# Patient Record
Sex: Female | Born: 1960 | Race: Black or African American | Hispanic: No | Marital: Single | Smoking: Never smoker
Health system: Southern US, Community
[De-identification: ages and names within clinical notes are randomized; demographics above are authoritative.]

## PROBLEM LIST (undated history)

## (undated) DIAGNOSIS — R569 Unspecified convulsions: Secondary | ICD-10-CM

## (undated) DIAGNOSIS — R011 Cardiac murmur, unspecified: Secondary | ICD-10-CM

## (undated) HISTORY — DX: Cardiac murmur, unspecified: R01.1

---

## 2002-03-11 ENCOUNTER — Emergency Department (HOSPITAL_COMMUNITY): Admission: EM | Admit: 2002-03-11 | Discharge: 2002-03-11 | Payer: Self-pay | Admitting: Emergency Medicine

## 2002-03-11 ENCOUNTER — Encounter: Payer: Self-pay | Admitting: Emergency Medicine

## 2002-03-11 ENCOUNTER — Encounter: Payer: Self-pay | Admitting: Neurology

## 2002-07-22 ENCOUNTER — Encounter (INDEPENDENT_AMBULATORY_CARE_PROVIDER_SITE_OTHER): Payer: Self-pay | Admitting: *Deleted

## 2002-07-22 ENCOUNTER — Ambulatory Visit (HOSPITAL_BASED_OUTPATIENT_CLINIC_OR_DEPARTMENT_OTHER): Admission: RE | Admit: 2002-07-22 | Discharge: 2002-07-22 | Payer: Self-pay | Admitting: Obstetrics and Gynecology

## 2008-03-07 ENCOUNTER — Emergency Department (HOSPITAL_COMMUNITY): Admission: EM | Admit: 2008-03-07 | Discharge: 2008-03-07 | Payer: Self-pay | Admitting: Emergency Medicine

## 2011-03-07 NOTE — Consult Note (Signed)
[place]. [facility]  Patient:    Jasmine Lester, Jasmine Lester Visit Number: [id number] MRN: [id number]          Service Type: EMS Location: [facility] [place] Attending Physician:  Devoria Albe Dictated by:   Genene Churn. Love, M.D. Proc. Date: 03/11/02 Admit Date:  03/11/2002 Discharge Date: 03/11/2002   CC:         Jasmine Lester, M.D. [facility]   Consultation Report  PATIENT ADDRESS:  [address], [place], [place] [address]  DATE OF BIRTH:  [birth year]  REASON FOR CONSULTATION:  This 50 year old, right-handed, black, single female who lives with her mother is seen in the emergency room at the request of the emergency room physician, Dr. Rae Lester, for evaluation of seizure.  HISTORY OF PRESENT ILLNESS:  Jasmine Lester is a 50 year old, right-handed, black, single female who lives with her mother.  She has no known prior history of seizures.  This morning at about 4 a.m., her mother heard a thump and found the patient having a generalized seizure.  The patient had no history of previous seizures and denies any known warning of seizures such as macropsia, micropsia, deja vu, strange odors or taste.  There was no urinary or bowel incontinence.  There was no tongue biting, and she was brought to the [facility] Emergency Room where initial CT scan showed evidence of calcium versus hemorrhage in the right parietal region and then also an area in the left temporal region.  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  The possibility of metastatic melanoma could not 100% be ruled out.  The patient has lost some weight recently, and she has a known long history of anemia for which she has been on iron therapy and Centrum Silver once per day, but she is on no other medications.  She denies any history of drug or alcohol abuse.  There has been no recent history of headaches, single eye  vision loss, double vision, swallowing problems, slurred speech or seizures.  She denies any known history of head trauma.  PAST MEDICAL HISTORY:  Significant for anemia.  There has been no hypertension, diabetes, heart disease, stroke, cancer, convulsions, unconsciousness, or veneral disease.  FAMILY HISTORY:  Her mother is age 32 living and well.  Her father died at 63 in a motor vehicle accident.  She has one sister, 3, living and well.  She has one son, 53, living and well.  There is no family history of seizures. There is no family history of stroke, cancer, or convulsions.  Her neonatal history reveals that she was 6-pound, full-term pregnancy.  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  She has stat breathing and crying time and normal developmental milestones.  She finished high school and is currently working at a call center.  MEDICATIONS:  Ferrous sulfate and Centrum Silver.  HABITS:  She does not drink alcohol.  She does not smoke cigarettes.  ALLERGIES:  No known allergies.  PHYSICAL EXAMINATION:  GENERAL:  Well-developed, pleasant black female in no acute distress.  VITAL SIGNS:  Blood pressure right and left arm was 120/80, heart rate 64 and regular.  HEAD AND NECK:  There were no cranial, carotid, or supraclavicular bruits heard.  Neck flexion and extension maneuvers were unremarkable.  She had some loss of hair in the right parietal region.  NEUROLOGIC:  Mental Status:  She was alert and oriented x 3.  She followed one, two, and three-step commands.  There was no denial of illness over the left side of her body.  Cranial nerve examination revealed visual fields to be full, disks flat. Spontaneous venous pulsation seen.  Extraocular movements full.  Corneals present.  Facial sensation equal.  No facial sensory asymmetry, but she had definite decrease in left nasolabial fold versus the right.  Tongue  was midline.  Uvula was midline.  Gags were present.  Sternocleidomastoid and trapezius testing were normal.  Motor examination revealed 5/5 strength proximally and distally in the upper and lower extremities without any evidence of proximal, pronator, or distal drift.  Coordination testing revealed finger-to-nose, heel-to-shin, and rapid alternating movements to be normal.  Sensory examination was intact to pinprick, touch, change of position, and vibration testing.  Deep tendon reflexes were 1 to 2+, and plantar responses were downgoing.  Gait examination was not performed.  DIAGNOSTIC DATA:  Her white blood cell count was 6500, hemoglobin 11.5, hematocrit 35.4, platelet count 268,000.  Sodium 141, potassium 3.6, chloride 106, CO2 content 29, BUN 7, creatinine 0.7, glucose 110, calcium 9.3.  Liver function tests normal.  Chest x-ray pending.  CT scan of the brain and MRI of the brain as above.  IMPRESSION: 1. Seizure (Code 345.1). 2. Suspect right parietal and left temporal cavernous arteriovenous    malformation. 3. Anemia.  PLAN:  Place patient on seizure precautions and have her not drive a car. Will place her on Tegretol XR 200 mg b.i.d. and warn her about side effects. She will have a CBC, comprehensive metabolic panel, and carbamazepine level in three weeks.  She will return to see me or Jasmine Lester in four weeks. Dictated by:   Genene Churn. Love, M.D. Attending Physician:  Devoria Albe DD:  03/11/02 TD:  03/14/02 Job: [id number]

## 2011-03-07 NOTE — Op Note (Signed)
   NAMEZITLALI, PRIMM                       ACCOUNT NO.:  [id number]   MEDICAL RECORD NO.:  [id number]                   PATIENT TYPE:  AMB   LOCATION:  [facility]                                 FACILITY:  [facility]   PHYSICIAN:  Katherine Roan, M.D.               DATE OF BIRTH:  20-Apr-1961   DATE OF PROCEDURE:  07/22/2002  DATE OF DISCHARGE:                                 OPERATIVE REPORT   PREOPERATIVE DIAGNOSIS:  Large uterine fibroid with menorrhagia.   POSTOPERATIVE DIAGNOSIS:  Large uterine fibroid with menorrhagia.   OPERATION:  Hysteroscopy with resection of large uterine fibroid.   DESCRIPTION OF PROCEDURE:  The patient was placed in lithotomy position and  examined under anesthesia.  The uterus was about 10-12 weeks in size,  mobile.  No other adnexal masses were noted.  The cervix was then carefully  dilated, and the hysteroscope was inserted.  There were two large uterine  fibroids that were submucosal in nature.  These were resected, and all of  the resected material was sent to the lab for study.  About halfway through  the procedure, I injected the uterus with a Pitressin solution and then  completed the resection.  All of the resected material was sent to the lab  for study.  No unusual blood loss occurred.  There was no fluid deficit.  Ms. Dorwart tolerated the procedure well and was sent to the recovery room  in good condition.                                               Katherine Roan, M.D.    SDM/MEDQ  D:  07/22/2002  T:  07/22/2002  Job:  [id number]   cc:   Genene Churn. Love, MD  [address]  [place]  [place] [address]  Fax: [phone]

## 2014-09-15 ENCOUNTER — Emergency Department (HOSPITAL_COMMUNITY): Payer: Self-pay

## 2014-09-15 ENCOUNTER — Observation Stay (HOSPITAL_COMMUNITY)
Admission: EM | Admit: 2014-09-15 | Discharge: 2014-09-17 | Disposition: A | Discharge status: Home / Self Care | Payer: Self-pay | Attending: General Surgery | Admitting: General Surgery

## 2014-09-15 ENCOUNTER — Encounter (HOSPITAL_COMMUNITY): Payer: Self-pay | Admitting: *Deleted

## 2014-09-15 DIAGNOSIS — R100 Acute abdomen: Secondary | ICD-10-CM | POA: Insufficient documentation

## 2014-09-15 DIAGNOSIS — Z9049 Acquired absence of other specified parts of digestive tract: Secondary | ICD-10-CM

## 2014-09-15 DIAGNOSIS — K8000 Calculus of gallbladder with acute cholecystitis without obstruction: Principal | ICD-10-CM | POA: Insufficient documentation

## 2014-09-15 DIAGNOSIS — R109 Unspecified abdominal pain: Secondary | ICD-10-CM

## 2014-09-15 DIAGNOSIS — K810 Acute cholecystitis: Secondary | ICD-10-CM

## 2014-09-15 DIAGNOSIS — R1011 Right upper quadrant pain: Secondary | ICD-10-CM

## 2014-09-15 HISTORY — DX: Unspecified convulsions: R56.9

## 2014-09-15 LAB — COMPREHENSIVE METABOLIC PANEL
ALBUMIN: 3.9 g/dL (ref 3.5–5.2)
ALT: 8 U/L (ref 0–35)
ANION GAP: 16 — AB (ref 5–15)
AST: 17 U/L (ref 0–37)
Alkaline Phosphatase: 101 U/L (ref 39–117)
BILIRUBIN TOTAL: 0.6 mg/dL (ref 0.3–1.2)
BUN: 5 mg/dL — AB (ref 6–23)
CHLORIDE: 100 meq/L (ref 96–112)
CO2: 22 mEq/L (ref 19–32)
CREATININE: 0.53 mg/dL (ref 0.50–1.10)
Calcium: 9.6 mg/dL (ref 8.4–10.5)
GLUCOSE: 111 mg/dL — AB (ref 70–99)
Potassium: 3.7 mEq/L (ref 3.7–5.3)
Sodium: 138 mEq/L (ref 137–147)
Total Protein: 8.6 g/dL — ABNORMAL HIGH (ref 6.0–8.3)

## 2014-09-15 LAB — URINALYSIS, ROUTINE W REFLEX MICROSCOPIC
BILIRUBIN URINE: NEGATIVE
GLUCOSE, UA: NEGATIVE mg/dL
Hgb urine dipstick: NEGATIVE
KETONES UR: 15 mg/dL — AB
Leukocytes, UA: NEGATIVE
Nitrite: NEGATIVE
PH: 7.5 (ref 5.0–8.0)
Protein, ur: NEGATIVE mg/dL
SPECIFIC GRAVITY, URINE: 1.006 (ref 1.005–1.030)
Urobilinogen, UA: 0.2 mg/dL (ref 0.0–1.0)

## 2014-09-15 LAB — CBC WITH DIFFERENTIAL/PLATELET
Basophils Absolute: 0 10*3/uL (ref 0.0–0.1)
Basophils Relative: 0 % (ref 0–1)
EOS PCT: 0 % (ref 0–5)
Eosinophils Absolute: 0 10*3/uL (ref 0.0–0.7)
HEMATOCRIT: 41.8 % (ref 36.0–46.0)
HEMOGLOBIN: 13.9 g/dL (ref 12.0–15.0)
LYMPHS PCT: 11 % — AB (ref 12–46)
Lymphs Abs: 1.1 10*3/uL (ref 0.7–4.0)
MCH: 26.5 pg (ref 26.0–34.0)
MCHC: 33.3 g/dL (ref 30.0–36.0)
MCV: 79.8 fL (ref 78.0–100.0)
MONO ABS: 0.7 10*3/uL (ref 0.1–1.0)
Monocytes Relative: 7 % (ref 3–12)
Neutro Abs: 8.9 10*3/uL — ABNORMAL HIGH (ref 1.7–7.7)
Neutrophils Relative %: 82 % — ABNORMAL HIGH (ref 43–77)
Platelets: 262 10*3/uL (ref 150–400)
RBC: 5.24 MIL/uL — ABNORMAL HIGH (ref 3.87–5.11)
RDW: 15.6 % — ABNORMAL HIGH (ref 11.5–15.5)
WBC: 10.8 10*3/uL — AB (ref 4.0–10.5)

## 2014-09-15 LAB — TROPONIN I: Troponin I: <0.3 ng/mL (ref <0.30)

## 2014-09-15 LAB — LIPASE, BLOOD: Lipase: 11 U/L (ref 11–59)

## 2014-09-15 MED ORDER — IOHEXOL 300 MG/ML  SOLN
100.0000 mL | Freq: Once | INTRAMUSCULAR | Status: AC | PRN
  Administered 2014-09-15: 100 mL via INTRAVENOUS

## 2014-09-15 MED ORDER — FENTANYL CITRATE 0.05 MG/ML IJ SOLN
50.0000 ug | Freq: Once | INTRAMUSCULAR | Status: AC
  Administered 2014-09-15: 50 ug via INTRAVENOUS

## 2014-09-15 MED ORDER — SODIUM CHLORIDE 0.9 % IV SOLN
1000.0000 mL | Freq: Once | INTRAVENOUS | Status: AC
  Administered 2014-09-15: 1000 mL via INTRAVENOUS

## 2014-09-15 MED ORDER — SODIUM CHLORIDE 0.9 % IV BOLUS (SEPSIS): 1000.0000 mL | Freq: Once | INTRAVENOUS | Status: AC

## 2014-09-15 MED ORDER — ONDANSETRON HCL 4 MG/2ML IJ SOLN
4.0000 mg | Freq: Once | INTRAMUSCULAR | Status: AC
  Administered 2014-09-15: 4 mg via INTRAVENOUS
  Filled 2014-09-15: qty 2

## 2014-09-15 MED ORDER — FENTANYL CITRATE 0.05 MG/ML IJ SOLN
50.0000 ug | Freq: Once | INTRAMUSCULAR | Status: AC
  Administered 2014-09-15: 50 ug via INTRAVENOUS
  Filled 2014-09-15: qty 2

## 2014-09-15 MED ORDER — FENTANYL CITRATE 0.05 MG/ML IJ SOLN
100.0000 ug | Freq: Once | INTRAMUSCULAR | Status: DC
  Filled 2014-09-15: qty 2

## 2014-09-15 MED ORDER — IOHEXOL 300 MG/ML  SOLN
25.0000 mL | Freq: Once | INTRAMUSCULAR | Status: AC | PRN
  Administered 2014-09-15: 25 mL via ORAL

## 2014-09-15 NOTE — ED Notes (Signed)
Pt in c/o abd pain and n/v/d that started last night, worse today

## 2014-09-15 NOTE — ED Provider Notes (Signed)
CSN: [id number]     Arrival date & time 09/15/14  1909 History   First MD Initiated Contact with Patient 09/15/14 1930     Chief Complaint  Patient presents with  . Abdominal Pain     (Consider location/radiation/quality/duration/timing/severity/associated sxs/prior Treatment) Patient is a 53 y.o. female presenting with abdominal pain.  Abdominal Pain Pain location:  Generalized Pain quality: sharp   Pain radiates to:  Does not radiate Pain severity:  Severe Onset quality:  Gradual Duration:  17 hours Timing:  Constant Progression:  Worsening Chronicity:  New Context comment:  Spontaneous Relieved by:  Nothing Worsened by:  Palpation and movement Associated symptoms: anorexia, nausea and vomiting   Associated symptoms: no constipation, no diarrhea and no fever     Past Medical History  Diagnosis Date  . Seizures    History reviewed. No pertinent past surgical history. History reviewed. No pertinent family history. History  Substance Use Topics  . Smoking status: Never Smoker   . Smokeless tobacco: Not on file  . Alcohol Use: Not on file   OB History    No data available     Review of Systems  Constitutional: Negative for fever.  Gastrointestinal: Positive for nausea, vomiting, abdominal pain and anorexia. Negative for diarrhea and constipation.  All other systems reviewed and are negative.     Allergies  Review of patient's allergies indicates no known allergies.  Home Medications   Prior to Admission medications   Medication Sig Start Date End Date Taking? Authorizing Provider  oxyCODONE-acetaminophen (PERCOCET/ROXICET) 5-325 MG per tablet Take 1-2 tablets by mouth every 4 (four) hours as needed for moderate pain. 09/17/14   Pedro Earls, MD   BP 139/82 mmHg  Pulse 91  Temp(Src) 98.1 F (36.7 C) (Oral)  Resp 18  SpO2 97%  LMP  Physical Exam  Constitutional: She is oriented to person, place, and time. She appears well-developed and  well-nourished.  HENT:  Head: Normocephalic and atraumatic.  Right Ear: External ear normal.  Left Ear: External ear normal.  Eyes: Conjunctivae and EOM are normal. Pupils are equal, round, and reactive to light.  Neck: Normal range of motion. Neck supple.  Cardiovascular: Normal rate, regular rhythm, normal heart sounds and intact distal pulses.   Pulmonary/Chest: Effort normal and breath sounds normal.  Abdominal: Soft. Bowel sounds are normal. There is generalized tenderness.  Musculoskeletal: Normal range of motion.  Neurological: She is alert and oriented to person, place, and time.  Skin: Skin is warm and dry.  Vitals reviewed.   ED Course  Procedures (including critical care time) Labs Review Labs Reviewed  CBC WITH DIFFERENTIAL - Abnormal; Notable for the following:    WBC 10.8 (*)    RBC 5.24 (*)    RDW 15.6 (*)    Neutrophils Relative % 82 (*)    Neutro Abs 8.9 (*)    Lymphocytes Relative 11 (*)    All other components within normal limits  URINALYSIS, ROUTINE W REFLEX MICROSCOPIC - Abnormal; Notable for the following:    Ketones, ur 15 (*)    All other components within normal limits  COMPREHENSIVE METABOLIC PANEL - Abnormal; Notable for the following:    Glucose, Bld 111 (*)    BUN 5 (*)    Total Protein 8.6 (*)    Anion gap 16 (*)    All other components within normal limits  SURGICAL PCR SCREEN  LIPASE, BLOOD  TROPONIN I  SURGICAL PATHOLOGY    Imaging Review Ct  Abdomen Pelvis W Contrast  09/15/2014   CLINICAL DATA:  Acute generalized abdominal pain.  EXAM: CT ABDOMEN AND PELVIS WITH CONTRAST  TECHNIQUE: Multidetector CT imaging of the abdomen and pelvis was performed using the standard protocol following bolus administration of intravenous contrast.  CONTRAST:  130mL OMNIPAQUE IOHEXOL 300 MG/ML  SOLN  COMPARISON:  None.  FINDINGS: Visualized lung bases appear normal. No significant osseous abnormality is noted.  Cholelithiasis is noted with minimal  pericholecystic fluid seen. The liver, spleen and pancreas appear normal. Adrenal glands appear normal. Cortical scarring of right kidney is noted. Simple cyst is seen in lower pole. No hydronephrosis or renal obstruction is noted. There is no evidence of bowel obstruction.  Enlarged uterus is noted with multiple fibroids. The largest measures 9.6 x 8.3 cm. 3.1 cm exophytic fibroid is node arising from the left of the uterine fundus. Ovaries appear normal. No significant adenopathy is noted. Urinary bladder appears normal.  IMPRESSION: Cholelithiasis is noted with minimal pericholecystic fluid. Further evaluation with HIDA scan is recommended to evaluate for possible cholecystitis.  Cortical scarring of right kidney is noted. No hydronephrosis or renal obstruction is noted.  Uterus is significantly enlarged consistent with fibroid uterus. The largest individual fibroid measures 9.6 cm in diameter.   Electronically Signed   By: Sabino Dick M.D.   On: 09/15/2014 21:15   US Abdomen Limited  09/15/2014   CLINICAL DATA:  Acute onset of right upper quadrant abdominal pain. Initial encounter.  EXAM: US ABDOMEN LIMITED - RIGHT UPPER QUADRANT  COMPARISON:  CT of the abdomen and pelvis performed earlier today at 8:47 p.m.  FINDINGS: Gallbladder:  There is diffuse gallbladder wall thickening, measuring 0.4 cm, with numerous stones dependently in the gallbladder. The gallbladder is mildly distended. A positive ultrasonographic Murphy's sign is elicited, raising concern for acute cholecystitis. No definite pericholecystic fluid is seen.  Common bile duct:  Diameter: 0.5 cm, within normal limits in caliber.  Liver:  A 1.0 cm echogenic focus at the medial right hepatic lobe is thought reflects a hemangioma, given its imaging characteristics. No additional focal lesions are seen. The liver is otherwise unremarkable in appearance.  IMPRESSION: 1. Suspect acute cholecystitis, with diffuse gallbladder wall thickening, mild  gallbladder distention and underlying cholelithiasis. Positive ultrasonographic Murphy's sign elicited. 2. Likely 1.0 cm hemangioma incidentally noted within the medial right hepatic lobe.   Electronically Signed   By: Garald Balding M.D.   On: 09/15/2014 23:53     EKG Interpretation   Date/Time:  Friday September 15 2014 19:49:43 EST Ventricular Rate:  81 PR Interval:  148 QRS Duration: 84 QT Interval:  368 QTC Calculation: 427 R Axis:   68 Text Interpretation:  Sinus rhythm No old tracing to compare Confirmed by  Debby Freiberg [phone]) on 09/15/2014 10:15:56 PM      MDM   Final diagnoses:  Abdominal pain, acute  RUQ abdominal pain  Acute cholecystitis    53 y.o. female with pertinent PMH of seizures presents with abdominal pain as described above. No infectious symptoms. She has had nausea and vomiting with abdominal pain. On arrival vital signs and physical exam as above. Patient has generalized abdominal tenderness however this was worse in the epigastrium. Labs obtained and as above. CT scan of the abdomen obtained.   This demonstrated pericholecystic fluid.  [place] ordered.  This demonstrated cholecystitis.  Admitted to surgery  1. Acute cholecystitis   2. Abdominal pain, acute   3. RUQ abdominal pain  Debby Freiberg, MD 09/17/14 [phone]

## 2014-09-16 ENCOUNTER — Observation Stay (HOSPITAL_COMMUNITY): Payer: Self-pay | Admitting: Certified Registered"

## 2014-09-16 ENCOUNTER — Encounter (HOSPITAL_COMMUNITY)
Admission: EM | Disposition: A | Discharge status: Home / Self Care | Payer: Self-pay | Source: Home / Self Care | Attending: Emergency Medicine

## 2014-09-16 DIAGNOSIS — K810 Acute cholecystitis: Secondary | ICD-10-CM | POA: Diagnosis present

## 2014-09-16 HISTORY — PX: CHOLECYSTECTOMY: SHX55

## 2014-09-16 LAB — SURGICAL PCR SCREEN
MRSA, PCR: NEGATIVE
STAPHYLOCOCCUS AUREUS: NEGATIVE

## 2014-09-16 SURGERY — LAPAROSCOPIC CHOLECYSTECTOMY WITH INTRAOPERATIVE CHOLANGIOGRAM
Anesthesia: General | Site: Abdomen

## 2014-09-16 MED ORDER — DEXAMETHASONE SODIUM PHOSPHATE 10 MG/ML IJ SOLN
INTRAMUSCULAR | Status: DC | PRN
  Administered 2014-09-16: 4 mg via INTRAVENOUS

## 2014-09-16 MED ORDER — LACTATED RINGERS IV SOLN
INTRAVENOUS | Status: DC | PRN
  Administered 2014-09-16: 09:00:00 via INTRAVENOUS

## 2014-09-16 MED ORDER — PROPOFOL 10 MG/ML IV BOLUS
INTRAVENOUS | Status: AC
  Filled 2014-09-16: qty 20

## 2014-09-16 MED ORDER — SUCCINYLCHOLINE CHLORIDE 20 MG/ML IJ SOLN
INTRAMUSCULAR | Status: DC | PRN
  Administered 2014-09-16: 100 mg via INTRAVENOUS

## 2014-09-16 MED ORDER — MIDAZOLAM HCL 5 MG/5ML IJ SOLN
INTRAMUSCULAR | Status: DC | PRN
  Administered 2014-09-16: 2 mg via INTRAVENOUS

## 2014-09-16 MED ORDER — NEOSTIGMINE METHYLSULFATE 10 MG/10ML IV SOLN
INTRAVENOUS | Status: DC | PRN
  Administered 2014-09-16: 4 mg via INTRAVENOUS

## 2014-09-16 MED ORDER — HYDROMORPHONE HCL 1 MG/ML IJ SOLN
1.0000 mg | Freq: Once | INTRAMUSCULAR | Status: AC
  Administered 2014-09-16: 1 mg via INTRAVENOUS
  Filled 2014-09-16: qty 1

## 2014-09-16 MED ORDER — LIDOCAINE HCL (CARDIAC) 20 MG/ML IV SOLN
INTRAVENOUS | Status: DC | PRN
  Administered 2014-09-16: 60 mg via INTRAVENOUS

## 2014-09-16 MED ORDER — BUPIVACAINE-EPINEPHRINE 0.25% -1:200000 IJ SOLN
INTRAMUSCULAR | Status: DC | PRN
  Administered 2014-09-16: 20 mL

## 2014-09-16 MED ORDER — FENTANYL CITRATE 0.05 MG/ML IJ SOLN
INTRAMUSCULAR | Status: DC | PRN
  Administered 2014-09-16: 50 ug via INTRAVENOUS
  Administered 2014-09-16: 100 ug via INTRAVENOUS
  Administered 2014-09-16 (×2): 50 ug via INTRAVENOUS

## 2014-09-16 MED ORDER — DEXTROSE-NACL 5-0.9 % IV SOLN
INTRAVENOUS | Status: DC
  Administered 2014-09-16: 19:00:00 via INTRAVENOUS

## 2014-09-16 MED ORDER — HYDROMORPHONE HCL 1 MG/ML IJ SOLN
INTRAMUSCULAR | Status: AC
  Administered 2014-09-16: 0.5 mg via INTRAVENOUS
  Filled 2014-09-16: qty 1

## 2014-09-16 MED ORDER — BUPIVACAINE-EPINEPHRINE (PF) 0.25% -1:200000 IJ SOLN
INTRAMUSCULAR | Status: AC
  Filled 2014-09-16: qty 30

## 2014-09-16 MED ORDER — PROPOFOL 10 MG/ML IV BOLUS
INTRAVENOUS | Status: DC | PRN
  Administered 2014-09-16: 120 mg via INTRAVENOUS

## 2014-09-16 MED ORDER — DEXTROSE-NACL 5-0.9 % IV SOLN
INTRAVENOUS | Status: DC
  Administered 2014-09-16: 03:00:00 via INTRAVENOUS

## 2014-09-16 MED ORDER — FENTANYL CITRATE 0.05 MG/ML IJ SOLN
INTRAMUSCULAR | Status: AC
  Filled 2014-09-16: qty 5

## 2014-09-16 MED ORDER — HYDROMORPHONE HCL 1 MG/ML IJ SOLN
0.2500 mg | INTRAMUSCULAR | Status: DC | PRN
  Administered 2014-09-16 (×4): 0.5 mg via INTRAVENOUS

## 2014-09-16 MED ORDER — ROCURONIUM BROMIDE 50 MG/5ML IV SOLN
INTRAVENOUS | Status: AC
  Filled 2014-09-16: qty 1

## 2014-09-16 MED ORDER — GLYCOPYRROLATE 0.2 MG/ML IJ SOLN
INTRAMUSCULAR | Status: DC | PRN
  Administered 2014-09-16: 0.6 mg via INTRAVENOUS

## 2014-09-16 MED ORDER — HYDROMORPHONE HCL 1 MG/ML IJ SOLN
INTRAMUSCULAR | Status: AC
Start: 2014-09-16 — End: 2014-09-16
  Administered 2014-09-16: 0.5 mg via INTRAVENOUS
  Filled 2014-09-16: qty 1

## 2014-09-16 MED ORDER — 0.9 % SODIUM CHLORIDE (POUR BTL) OPTIME
TOPICAL | Status: DC | PRN
  Administered 2014-09-16: 1000 mL

## 2014-09-16 MED ORDER — PROMETHAZINE HCL 25 MG/ML IJ SOLN: 6.2500 mg | INTRAMUSCULAR | Status: DC | PRN

## 2014-09-16 MED ORDER — OXYCODONE HCL 5 MG PO TABS: 5.0000 mg | ORAL_TABLET | Freq: Once | ORAL | Status: DC | PRN

## 2014-09-16 MED ORDER — OXYCODONE HCL 5 MG/5ML PO SOLN: 5.0000 mg | Freq: Once | ORAL | Status: DC | PRN

## 2014-09-16 MED ORDER — LACTATED RINGERS IV SOLN
INTRAVENOUS | Status: DC
  Administered 2014-09-16: 09:00:00 via INTRAVENOUS

## 2014-09-16 MED ORDER — BUPIVACAINE HCL (PF) 0.25 % IJ SOLN
INTRAMUSCULAR | Status: AC
  Filled 2014-09-16: qty 30

## 2014-09-16 MED ORDER — HYDROMORPHONE HCL 1 MG/ML IJ SOLN
1.0000 mg | INTRAMUSCULAR | Status: DC | PRN
  Administered 2014-09-16: 1 mg via INTRAVENOUS
  Filled 2014-09-16: qty 1

## 2014-09-16 MED ORDER — ROCURONIUM BROMIDE 100 MG/10ML IV SOLN
INTRAVENOUS | Status: DC | PRN
  Administered 2014-09-16: 10 mg via INTRAVENOUS
  Administered 2014-09-16: 25 mg via INTRAVENOUS

## 2014-09-16 MED ORDER — DEXAMETHASONE SODIUM PHOSPHATE 4 MG/ML IJ SOLN
INTRAMUSCULAR | Status: AC
  Filled 2014-09-16: qty 1

## 2014-09-16 MED ORDER — METOPROLOL TARTRATE 1 MG/ML IV SOLN
INTRAVENOUS | Status: DC | PRN
  Administered 2014-09-16: 1 mg via INTRAVENOUS

## 2014-09-16 MED ORDER — ONDANSETRON HCL 4 MG/2ML IJ SOLN: 4.0000 mg | Freq: Four times a day (QID) | INTRAMUSCULAR | Status: DC | PRN

## 2014-09-16 MED ORDER — SUCCINYLCHOLINE CHLORIDE 20 MG/ML IJ SOLN
INTRAMUSCULAR | Status: AC
  Filled 2014-09-16: qty 1

## 2014-09-16 MED ORDER — MIDAZOLAM HCL 2 MG/2ML IJ SOLN
INTRAMUSCULAR | Status: AC
  Filled 2014-09-16: qty 2

## 2014-09-16 MED ORDER — OXYCODONE-ACETAMINOPHEN 5-325 MG PO TABS
ORAL_TABLET | ORAL | Status: AC
  Administered 2014-09-16: 2 via ORAL
  Filled 2014-09-16: qty 2

## 2014-09-16 MED ORDER — ONDANSETRON HCL 4 MG/2ML IJ SOLN
INTRAMUSCULAR | Status: DC | PRN
Start: 2014-09-16 — End: 2014-09-16
  Administered 2014-09-16: 4 mg via INTRAVENOUS

## 2014-09-16 MED ORDER — DEXTROSE 5 % IV SOLN
1.0000 g | INTRAVENOUS | Status: DC
  Administered 2014-09-16: 1 g via INTRAVENOUS
  Filled 2014-09-16 (×2): qty 10

## 2014-09-16 MED ORDER — OXYCODONE-ACETAMINOPHEN 5-325 MG PO TABS
1.0000 | ORAL_TABLET | ORAL | Status: DC | PRN
  Administered 2014-09-16 – 2014-09-17 (×3): 2 via ORAL
  Filled 2014-09-16 (×2): qty 2

## 2014-09-16 MED ORDER — SODIUM CHLORIDE 0.9 % IR SOLN
Status: DC | PRN
  Administered 2014-09-16: 1

## 2014-09-16 SURGICAL SUPPLY — 45 items
APPLIER CLIP 5 13 M/L LIGAMAX5 (MISCELLANEOUS) ×3
APPLIER CLIP ROT 10 11.4 M/L (STAPLE)
BLADE SURG ROTATE 9660 (MISCELLANEOUS) IMPLANT
CANISTER SUCTION 2500CC (MISCELLANEOUS) ×3 IMPLANT
CHLORAPREP W/TINT 26ML (MISCELLANEOUS) ×3 IMPLANT
CLIP APPLIE 5 13 M/L LIGAMAX5 (MISCELLANEOUS) ×1 IMPLANT
CLIP APPLIE ROT 10 11.4 M/L (STAPLE) IMPLANT
CLOSURE WOUND 1/2 X4 (GAUZE/BANDAGES/DRESSINGS) ×1
COVER MAYO STAND STRL (DRAPES) ×3 IMPLANT
COVER SURGICAL LIGHT HANDLE (MISCELLANEOUS) ×3 IMPLANT
DERMABOND ADHESIVE PROPEN (GAUZE/BANDAGES/DRESSINGS) ×2
DERMABOND ADVANCED (GAUZE/BANDAGES/DRESSINGS) ×2
DERMABOND ADVANCED .7 DNX12 (GAUZE/BANDAGES/DRESSINGS) ×1 IMPLANT
DERMABOND ADVANCED .7 DNX6 (GAUZE/BANDAGES/DRESSINGS) ×1 IMPLANT
DRAPE C-ARM 42X72 X-RAY (DRAPES) ×3 IMPLANT
DRAPE LAPAROSCOPIC ABDOMINAL (DRAPES) ×3 IMPLANT
DRAPE UTILITY XL STRL (DRAPES) ×6 IMPLANT
DRSG TEGADERM 2-3/8X2-3/4 SM (GAUZE/BANDAGES/DRESSINGS) ×12 IMPLANT
DRSG TEGADERM 4X4.75 (GAUZE/BANDAGES/DRESSINGS) ×3 IMPLANT
ELECT REM PT RETURN 9FT ADLT (ELECTROSURGICAL) ×3
ELECTRODE REM PT RTRN 9FT ADLT (ELECTROSURGICAL) ×1 IMPLANT
GLOVE BIOGEL PI IND STRL 8 (GLOVE) ×1 IMPLANT
GLOVE BIOGEL PI INDICATOR 8 (GLOVE) ×2
GLOVE ECLIPSE 7.5 STRL STRAW (GLOVE) ×3 IMPLANT
GOWN STRL REUS W/ TWL LRG LVL3 (GOWN DISPOSABLE) ×3 IMPLANT
GOWN STRL REUS W/TWL LRG LVL3 (GOWN DISPOSABLE) ×6
KIT BASIN OR (CUSTOM PROCEDURE TRAY) ×3 IMPLANT
KIT ROOM TURNOVER OR (KITS) ×3 IMPLANT
NS IRRIG 1000ML POUR BTL (IV SOLUTION) ×3 IMPLANT
PAD ARMBOARD 7.5X6 YLW CONV (MISCELLANEOUS) ×3 IMPLANT
POUCH SPECIMEN RETRIEVAL 10MM (ENDOMECHANICALS) ×3 IMPLANT
SCISSORS LAP 5X35 DISP (ENDOMECHANICALS) ×3 IMPLANT
SET CHOLANGIOGRAPH 5 50 .035 (SET/KITS/TRAYS/PACK) ×3 IMPLANT
SET IRRIG TUBING LAPAROSCOPIC (IRRIGATION / IRRIGATOR) ×3 IMPLANT
SLEEVE ENDOPATH XCEL 5M (ENDOMECHANICALS) ×3 IMPLANT
SPECIMEN JAR SMALL (MISCELLANEOUS) ×3 IMPLANT
STRIP CLOSURE SKIN 1/2X4 (GAUZE/BANDAGES/DRESSINGS) ×2 IMPLANT
SUT MNCRL AB 4-0 PS2 18 (SUTURE) ×3 IMPLANT
TOWEL OR 17X24 6PK STRL BLUE (TOWEL DISPOSABLE) ×3 IMPLANT
TOWEL OR 17X26 10 PK STRL BLUE (TOWEL DISPOSABLE) ×3 IMPLANT
TRAY LAPAROSCOPIC (CUSTOM PROCEDURE TRAY) ×3 IMPLANT
TROCAR XCEL BLUNT TIP 100MML (ENDOMECHANICALS) ×3 IMPLANT
TROCAR XCEL NON-BLD 11X100MML (ENDOMECHANICALS) IMPLANT
TROCAR XCEL NON-BLD 5MMX100MML (ENDOMECHANICALS) ×6 IMPLANT
TUBING INSUFFLATION (TUBING) ×3 IMPLANT

## 2014-09-16 NOTE — H&P (Signed)
Jasmine Lester is an 53 y.o. female.   Chief Complaint: Abdominal pain HPI: Patient is a 53 year old female with with our history of abdominal pain in the epigastrium and right upper quadrant. Patient states that the pain began approximately 2 AM the previous night. She states that she had Thanksgiving dinner and subsequent to that she began with abdominal pain. Patient had some nausea and vomiting while at home.  Upon evaluation in the ER patient underwent CT scan and ultrasound revealed signs consistent with acute cholecystitis and cholelithiasis. Patient's LFTs were within normal limits.  Patient has a history of C-section 32 years ago.  Past Medical History  Diagnosis Date  . Seizures     History reviewed. No pertinent past surgical history.  History reviewed. No pertinent family history. Social History:  reports that she has never smoked. She does not have any smokeless tobacco history on file. Her alcohol and drug histories are not on file.  Allergies: No Known Allergies   (Not in a hospital admission)  Results for orders placed or performed during the hospital encounter of 09/15/14 (from the past 48 hour(s))  CBC WITH DIFFERENTIAL     Status: Abnormal   Collection Time: 09/15/14  7:26 PM  Result Value Ref Range   WBC 10.8 (H) 4.0 - 10.5 K/uL   RBC 5.24 (H) 3.87 - 5.11 MIL/uL   Hemoglobin 13.9 12.0 - 15.0 g/dL   HCT 41.8 36.0 - 46.0 %   MCV 79.8 78.0 - 100.0 fL   MCH 26.5 26.0 - 34.0 pg   MCHC 33.3 30.0 - 36.0 g/dL   RDW 15.6 (H) 11.5 - 15.5 %   Platelets 262 150 - 400 K/uL   Neutrophils Relative % 82 (H) 43 - 77 %   Neutro Abs 8.9 (H) 1.7 - 7.7 K/uL   Lymphocytes Relative 11 (L) 12 - 46 %   Lymphs Abs 1.1 0.7 - 4.0 K/uL   Monocytes Relative 7 3 - 12 %   Monocytes Absolute 0.7 0.1 - 1.0 K/uL   Eosinophils Relative 0 0 - 5 %   Eosinophils Absolute 0.0 0.0 - 0.7 K/uL   Basophils Relative 0 0 - 1 %   Basophils Absolute 0.0 0.0 - 0.1 K/uL  Comprehensive metabolic  panel     Status: Abnormal   Collection Time: 09/15/14  7:26 PM  Result Value Ref Range   Sodium 138 137 - 147 mEq/L   Potassium 3.7 3.7 - 5.3 mEq/L   Chloride 100 96 - 112 mEq/L   CO2 22 19 - 32 mEq/L   Glucose, Bld 111 (H) 70 - 99 mg/dL   BUN 5 (L) 6 - 23 mg/dL   Creatinine, Ser 0.53 0.50 - 1.10 mg/dL   Calcium 9.6 8.4 - 10.5 mg/dL   Total Protein 8.6 (H) 6.0 - 8.3 g/dL   Albumin 3.9 3.5 - 5.2 g/dL   AST 17 0 - 37 U/L    Comment: HEMOLYSIS AT THIS LEVEL MAY AFFECT RESULT   ALT 8 0 - 35 U/L   Alkaline Phosphatase 101 39 - 117 U/L   Total Bilirubin 0.6 0.3 - 1.2 mg/dL   GFR calc non Af Amer >90 >90 mL/min   GFR calc Af Amer >90 >90 mL/min    Comment: (NOTE) The eGFR has been calculated using the CKD EPI equation. This calculation has not been validated in all clinical situations. eGFR's persistently <90 mL/min signify possible Chronic Kidney Disease.    Anion gap 16 (H)  5 - 15  Lipase, blood     Status: None   Collection Time: 09/15/14  7:26 PM  Result Value Ref Range   Lipase 11 11 - 59 U/L  Troponin I     Status: None   Collection Time: 09/15/14  7:26 PM  Result Value Ref Range   Troponin I <0.30 <0.30 ng/mL    Comment:        Due to the release kinetics of cTnI, a negative result within the first hours of the onset of symptoms does not rule out myocardial infarction with certainty. If myocardial infarction is still suspected, repeat the test at appropriate intervals.   Urinalysis with microscopic     Status: Abnormal   Collection Time: 09/15/14  8:34 PM  Result Value Ref Range   Color, Urine YELLOW YELLOW   APPearance CLEAR CLEAR   Specific Gravity, Urine 1.006 1.005 - 1.030   pH 7.5 5.0 - 8.0   Glucose, UA NEGATIVE NEGATIVE mg/dL   Hgb urine dipstick NEGATIVE NEGATIVE   Bilirubin Urine NEGATIVE NEGATIVE   Ketones, ur 15 (A) NEGATIVE mg/dL   Protein, ur NEGATIVE NEGATIVE mg/dL   Urobilinogen, UA 0.2 0.0 - 1.0 mg/dL   Nitrite NEGATIVE NEGATIVE   Leukocytes,  UA NEGATIVE NEGATIVE    Comment: MICROSCOPIC NOT DONE ON URINES WITH NEGATIVE PROTEIN, BLOOD, LEUKOCYTES, NITRITE, OR GLUCOSE <1000 mg/dL.   Ct Abdomen Pelvis W Contrast  09/15/2014   CLINICAL DATA:  Acute generalized abdominal pain.  EXAM: CT ABDOMEN AND PELVIS WITH CONTRAST  TECHNIQUE: Multidetector CT imaging of the abdomen and pelvis was performed using the standard protocol following bolus administration of intravenous contrast.  CONTRAST:  122m OMNIPAQUE IOHEXOL 300 MG/ML  SOLN  COMPARISON:  None.  FINDINGS: Visualized lung bases appear normal. No significant osseous abnormality is noted.  Cholelithiasis is noted with minimal pericholecystic fluid seen. The liver, spleen and pancreas appear normal. Adrenal glands appear normal. Cortical scarring of right kidney is noted. Simple cyst is seen in lower pole. No hydronephrosis or renal obstruction is noted. There is no evidence of bowel obstruction.  Enlarged uterus is noted with multiple fibroids. The largest measures 9.6 x 8.3 cm. 3.1 cm exophytic fibroid is node arising from the left of the uterine fundus. Ovaries appear normal. No significant adenopathy is noted. Urinary bladder appears normal.  IMPRESSION: Cholelithiasis is noted with minimal pericholecystic fluid. Further evaluation with HIDA scan is recommended to evaluate for possible cholecystitis.  Cortical scarring of right kidney is noted. No hydronephrosis or renal obstruction is noted.  Uterus is significantly enlarged consistent with fibroid uterus. The largest individual fibroid measures 9.6 cm in diameter.   Electronically Signed   By: JSabino DickM.D.   On: 09/15/2014 21:15   [place]dom[place] Limited  09/15/2014   CLINICAL DATA:  Acute onset of right upper quadrant abdominal pain. Initial encounter.  EXAM: [place]DOMEN LIMITED - RIGHT UPPER QUADRANT  COMPARISON:  CT of the abdomen and pelvis performed earlier today at 8:47 p.m.  FINDINGS: Gallbladder:  There is diffuse gallbladder wall  thickening, measuring 0.4 cm, with numerous stones dependently in the gallbladder. The gallbladder is mildly distended. A positive ultrasonographic Murphy's sign is elicited, raising concern for acute cholecystitis. No definite pericholecystic fluid is seen.  Common bile duct:  Diameter: 0.5 cm, within normal limits in caliber.  Liver:  A 1.0 cm echogenic focus at the medial right hepatic lobe is thought reflects a hemangioma, given its imaging characteristics. No additional  focal lesions are seen. The liver is otherwise unremarkable in appearance.  IMPRESSION: 1. Suspect acute cholecystitis, with diffuse gallbladder wall thickening, mild gallbladder distention and underlying cholelithiasis. Positive ultrasonographic Murphy's sign elicited. 2. Likely 1.0 cm hemangioma incidentally noted within the medial right hepatic lobe.   Electronically Signed   By: Garald Balding M.D.   On: 09/15/2014 23:53    Review of Systems  Constitutional: Negative for weight loss.  HENT: Negative for ear discharge, ear pain, hearing loss and tinnitus.   Eyes: Negative for blurred vision, double vision, photophobia and pain.  Respiratory: Negative for cough, sputum production and shortness of breath.   Cardiovascular: Negative for chest pain.  Gastrointestinal: Positive for nausea and vomiting. Negative for abdominal pain.  Genitourinary: Negative for dysuria, urgency, frequency and flank pain.  Musculoskeletal: Negative for myalgias, back pain, joint pain, falls and neck pain.  Neurological: Negative for dizziness, tingling, sensory change, focal weakness, loss of consciousness and headaches.  Endo/Heme/Allergies: Does not bruise/bleed easily.  Psychiatric/Behavioral: Negative for depression, memory loss and substance abuse. The patient is not nervous/anxious.     Blood pressure 131/76, pulse 95, temperature 98.7 F (37.1 C), temperature source Oral, resp. rate 17, SpO2 98 %. Physical Exam  Vitals  reviewed. Constitutional: She is oriented to person, place, and time. She appears well-developed and well-nourished. She is cooperative. No distress. Cervical collar and nasal cannula in place.  HENT:  Head: Normocephalic and atraumatic. Head is without raccoon's eyes, without Battle's sign, without abrasion, without contusion and without laceration.  Right Ear: Hearing, tympanic membrane, external ear and ear canal normal. No lacerations. No drainage or tenderness. No foreign bodies. Tympanic membrane is not perforated. No hemotympanum.  Left Ear: Hearing, tympanic membrane, external ear and ear canal normal. No lacerations. No drainage or tenderness. No foreign bodies. Tympanic membrane is not perforated. No hemotympanum.  Nose: Nose normal. No nose lacerations, sinus tenderness, nasal deformity or nasal septal hematoma. No epistaxis.  Mouth/Throat: Uvula is midline, oropharynx is clear and moist and mucous membranes are normal. No lacerations.  Eyes: Conjunctivae, EOM and lids are normal. Pupils are equal, round, and reactive to light. No scleral icterus.  Neck: Trachea normal. No JVD present. No spinous process tenderness and no muscular tenderness present. Carotid bruit is not present. No thyromegaly present.  Cardiovascular: Normal rate, regular rhythm, normal heart sounds, intact distal pulses and normal pulses.   Respiratory: Effort normal and breath sounds normal. No respiratory distress. She exhibits no tenderness, no bony tenderness, no laceration and no crepitus.  GI: Soft. Normal appearance. She exhibits no distension. Bowel sounds are decreased. There is tenderness in the right upper quadrant. There is no rigidity, no rebound, no guarding and no CVA tenderness.  Musculoskeletal: Normal range of motion. She exhibits no edema or tenderness.  Lymphadenopathy:    She has no cervical adenopathy.  Neurological: She is alert and oriented to person, place, and time. She has normal strength. No  cranial nerve deficit or sensory deficit. GCS eye subscore is 4. GCS verbal subscore is 5. GCS motor subscore is 6.  Skin: Skin is warm, dry and intact. She is not diaphoretic.  Psychiatric: She has a normal mood and affect. Her speech is normal and behavior is normal.     Assessment/Plan 53 year old female with acute cholecystitis  1. Admission, nothing by mouth, IV fluids 2. Antibiotics 3. Consent for laparoscopic cholecystectomy with IOC 4. We will schedule the patient for laparoscopic cholecystectomy by Dr. Hulen Skains in the a.m.  Rosario Jacks., Ellianna Ruest 09/16/2014, 1:48 AM

## 2014-09-16 NOTE — Interval H&P Note (Signed)
History and Physical Interval Note: Patient continues to be somewhat symptomatic.  Will not necessarily perform IOC. 09/16/2014 9:41 AM  Jasmine Lester  has presented today for surgery, with the diagnosis of cholelithiasis  The various methods of treatment have been discussed with the patient and family. After consideration of risks, benefits and other options for treatment, the patient has consented to  Procedure(s): LAPAROSCOPIC CHOLECYSTECTOMY WITH INTRAOPERATIVE CHOLANGIOGRAM (N/A) as a surgical intervention .  The patient's history has been reviewed, patient examined, no change in status, stable for surgery.  I have reviewed the patient's chart and labs.  Questions were answered to the patient's satisfaction.     Amol Domanski, JAY

## 2014-09-16 NOTE — Op Note (Signed)
OPERATIVE REPORT  DATE OF OPERATION: 09/15/2014 - 09/16/2014  PATIENT:  Jasmine Lester  53 y.o. female  PRE-OPERATIVE DIAGNOSIS:  cholelithiasis  POST-OPERATIVE DIAGNOSIS:  Cholelithiasis and acute cholecystitis  PROCEDURE:  Procedure(s): LAPAROSCOPIC CHOLECYSTECTOMY WITHOUT INTRAOPERATIVE CHOLANGIOGRAM  SURGEON:  Surgeon(s): Doreen Salvage, MD  ASSISTANT: None  ANESTHESIA:   general  EBL: <30 ml  BLOOD ADMINISTERED: none  DRAINS: none   SPECIMEN:  Source of Specimen:  Gallbladder and stones  COUNTS CORRECT:  YES  PROCEDURE DETAILS: The patient was taken to the operating room and placed on the table in the supine position.  After an adequate endotracheal anesthetic was administered, the patient was prepped with ChloroPrep, and then draped in the usual manner exposing the entire abdomen laterally, inferiorly and up  to the costal margins.  After a proper timeout was performed including identifying the patient and the procedure to be performed, a supraumbilical 0.9FG midline incision was made using a #15 blade.  This was taken down to the fascia which was then incised with a #15 blade.  The edges of the fascia were tented up with Kocher clamps as the preperitoneal space was penetrated with a Kelly clamp into the peritoneum.  Once this was done, a pursestring suture of 0 Vicryl was passed around the fascial opening.  This was subsequently used to secure the [facility] cannula which was passed into the peritoneal cavity.  Once the [facility] cannula was in place, carbon dioxide gas was insufflated into the peritoneal cavity up to a maximal intra-abdominal pressure of 31mm Hg.The laparoscope, with attached camera and light source, was passed into the peritoneal cavity to visualize the direct insertion of two right upper quadrant 41mm cannulas, and a sup-xiphoid 80mm cannula.  Once all cannulas were in place, the dissection was begun.  Two ratcheted graspers were attached to the dome and  infundibulum of the gallbladder and retracted towards the anterior abdominal wall and the right upper quadrant.  Using cautery attached to a dissecting forceps, the peritoneum overlaying the triangle of Chalot and the hepatoduodenal triangle was dissected away exposing the cystic duct and the cystic artery.  The cystic artery was clipped proximally and distally then transected.  A clip was placed on the gallbladder side of the cystic duct, then the distal cystic duct was clipped multiple times then transected.  The gallbladder was then dissected out of the hepatic bed without event.  It was retrieved from the abdomen (using an EndoCatch bag) without event.  Once the gallbladder was removed, the bed was inspected for hemostasis.  Once excellent hemostasis was obtained all gas and fluids were aspirated from above the liver, then the cannulas were removed.  The supraumbilical incision was closed using the pursestring suture which was in place.  0.25% bupivicaine with epinephrine was injected at all sites.  All 23mm or greater cannula sites were close using a running subcuticular stitch of 4-0 Monocryl.  5.36mm cannula sites were closed with Dermabond only.Steri-Strips and Tagaderm were used to complete the dressings at all sites.  At this point all needle, sponge, and instrument counts were correct.The patient was awakened from anesthesia and taken to the PACU in stable condition.  PATIENT DISPOSITION:  PACU - hemodynamically stable.   Jacquette Canales, JAY 11/28/201511:26 AM

## 2014-09-16 NOTE — Plan of Care (Signed)
Problem: Phase I Progression Outcomes Goal: Pain controlled with appropriate interventions Outcome: Completed/Met Date Met:  09/16/14 Goal: Incision/dressings dry and intact Outcome: Completed/Met Date Met:  09/16/14 Goal: Vital signs/hemodynamically stable Outcome: Completed/Met Date Met:  09/16/14

## 2014-09-16 NOTE — Progress Notes (Signed)
UR completed 

## 2014-09-16 NOTE — Transfer of Care (Signed)
Immediate Anesthesia Transfer of Care Note  Patient: Jasmine Lester  Procedure(s) Performed: Procedure(s): LAPAROSCOPIC CHOLECYSTECTOMY WITHOUT INTRAOPERATIVE CHOLANGIOGRAM (N/A)  Patient Location: PACU  Anesthesia Type:General  Level of Consciousness: awake, alert  and oriented  Airway & Oxygen Therapy: Patient connected to face mask oxygen  Post-op Assessment: Report given to PACU RN  Post vital signs: stable  Complications: No apparent anesthesia complications

## 2014-09-16 NOTE — Anesthesia Postprocedure Evaluation (Signed)
  Anesthesia Post-op Note  Patient: Jasmine Lester  Procedure(s) Performed: Procedure(s): LAPAROSCOPIC CHOLECYSTECTOMY WITHOUT INTRAOPERATIVE CHOLANGIOGRAM (N/A)  Patient Location: PACU  Anesthesia Type:General  Level of Consciousness: awake, alert  and oriented  Airway and Oxygen Therapy: Patient Spontanous Breathing  Post-op Pain: none  Post-op Assessment: Post-op Vital signs reviewed  Post-op Vital Signs: Reviewed  Last Vitals:  Filed Vitals:   09/16/14 1145  BP: 130/91  Pulse: 99  Temp:   Resp: 18    Complications: No apparent anesthesia complications

## 2014-09-16 NOTE — Anesthesia Procedure Notes (Signed)
Procedure Name: Intubation Date/Time: 09/16/2014 9:47 AM Performed by: Maeola Harman Pre-anesthesia Checklist: Patient identified, Emergency Drugs available, Suction available, Patient being monitored and Timeout performed Patient Re-evaluated:Patient Re-evaluated prior to inductionOxygen Delivery Method: Circle system utilized Preoxygenation: Pre-oxygenation with 100% oxygen Intubation Type: IV induction, Rapid sequence and Cricoid Pressure applied Ventilation: Mask ventilation without difficulty Laryngoscope Size: Mac and 3 Grade View: Grade II Tube type: Oral Tube size: 7.5 mm Number of attempts: 1 Airway Equipment and Method: Stylet Placement Confirmation: ETT inserted through vocal cords under direct vision,  positive ETCO2 and breath sounds checked- equal and bilateral Secured at: 22 cm Tube secured with: Tape Dental Injury: Teeth and Oropharynx as per pre-operative assessment

## 2014-09-16 NOTE — ED Provider Notes (Signed)
Care transferred from Dr. Colin Rhein on 53 year old with abdominal pain, nausea and vomiting.  Abdominal ultrasound consistent with acute cholecystitis with mild gallbladder distention,, or wall thickening, evidence of cholelithiasis.  2.  PVCs elevated at 10.8.  On repeat abdominal exam, patient has guarding in the right upper quadrant.  She also reportedly had a positive Murphy's sign during ultrasound.  General surgery consulted for admission  1. Acute cholecystitis   2. Abdominal pain, acute   3. RUQ abdominal pain      Ernestina Patches, MD 09/16/14 [phone]

## 2014-09-16 NOTE — Anesthesia Preprocedure Evaluation (Addendum)
Anesthesia Evaluation  Patient identified by MRN, date of birth, ID band Patient awake    Reviewed: Allergy & Precautions, H&P , NPO status , Patient's Chart, lab work & pertinent test results  Airway Mallampati: III  TM Distance: >3 FB Neck ROM: Full    Dental  (+) Dental Advisory Given, Poor Dentition,    Pulmonary neg pulmonary ROS,  breath sounds clear to auscultation        Cardiovascular negative cardio ROS  Rhythm:Regular Rate:Tachycardia + Systolic murmurs    Neuro/Psych Seizures -, Well Controlled,  negative psych ROS   GI/Hepatic negative GI ROS, Neg liver ROS,   Endo/Other  negative endocrine ROS  Renal/GU negative Renal ROS     Musculoskeletal negative musculoskeletal ROS (+)   Abdominal (+)  Abdomen: soft. Bowel sounds: normal.  Peds  Hematology negative hematology ROS (+)   Anesthesia Other Findings   Reproductive/Obstetrics                          Anesthesia Physical Anesthesia Plan  ASA: II  Anesthesia Plan: General   Post-op Pain Management:    Induction: Intravenous  Airway Management Planned: Oral ETT  Additional Equipment:   Intra-op Plan:   Post-operative Plan: Extubation in OR  Informed Consent: I have reviewed the patients History and Physical, chart, labs and discussed the procedure including the risks, benefits and alternatives for the proposed anesthesia with the patient or authorized representative who has indicated his/her understanding and acceptance.   Dental advisory given  Plan Discussed with: CRNA and Surgeon  Anesthesia Plan Comments:        Anesthesia Quick Evaluation

## 2014-09-17 DIAGNOSIS — Z9049 Acquired absence of other specified parts of digestive tract: Secondary | ICD-10-CM

## 2014-09-17 MED ORDER — OXYCODONE-ACETAMINOPHEN 5-325 MG PO TABS: 1.0000 | ORAL_TABLET | ORAL | Status: DC | PRN

## 2014-09-17 NOTE — Discharge Summary (Signed)
Physician Discharge Summary  Patient ID: Jasmine Lester MRN: [id number] DOB/AGE: January 10, 1961 53 y.o.  Admit date: 09/15/2014 Discharge date: 09/17/2014  Admission Diagnoses:  Acute cholecystitis  Discharge Diagnoses:  Same post cholecystectomy  Active Problems:   Status post laparoscopic cholecystectomy Nov 2015   Surgery:  Lap chole  Discharged Condition: improved  Hospital Course:   Had surgery following admission by Dr. Hulen Skains.  Acute cholecystitis found  Consults: none  Significant Diagnostic Studies: path pending    Discharge Exam: Blood pressure 132/76, pulse 90, temperature 98.3 F (36.8 C), temperature source Oral, resp. rate 17, SpO2 97 %. Usual degree of soreness  Disposition: Final discharge disposition not confirmed  Discharge Instructions    Diet - low sodium heart healthy    Complete by:  As directed      Discharge instructions    Complete by:  As directed   May shower at home     Increase activity slowly    Complete by:  As directed      No dressing needed    Complete by:  As directed             Medication List    TAKE these medications        oxyCODONE-acetaminophen 5-325 MG per tablet  Commonly known as:  PERCOCET/ROXICET  Take 1-2 tablets by mouth every 4 (four) hours as needed for moderate pain.           Follow-up Information    Follow up with WYATT, JAY, MD. Schedule an appointment as soon as possible for a visit in 3 weeks.   Specialty:  General Surgery   Contact information:   [address], [place], [place] [address] [phone]       Signed: Pedro Earls 09/17/2014, 7:56 AM

## 2014-09-17 NOTE — Discharge Instructions (Signed)

## 2014-09-18 ENCOUNTER — Encounter (HOSPITAL_COMMUNITY): Payer: Self-pay | Admitting: General Surgery

## 2015-11-11 IMAGING — CT CT ABD-PELV W/ CM
2 of 5 series · 16 of 46 positions shown, 18 images · IV contrast (APPLIED)
Comparison: None.

CLINICAL DATA: Acute generalized abdominal pain.

EXAM:
CT ABDOMEN AND PELVIS WITH CONTRAST
TECHNIQUE: Multidetector CT imaging of the abdomen and pelvis was performed
using the standard protocol following bolus administration of
intravenous contrast.
CONTRAST:  100mL OMNIPAQUE IOHEXOL 300 MG/ML  SOLN

[Series 2: abd/ pelvis 5.0 i30f 1 · axial · 0.70mm/px · z∈[+785,+1160]mm · 13 of 87 slices shown, 15 images]
[im 6/87  soft-tissue]
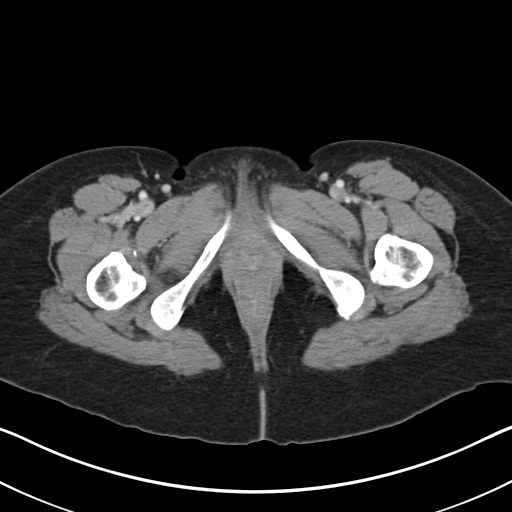
[im 6/87  bone]
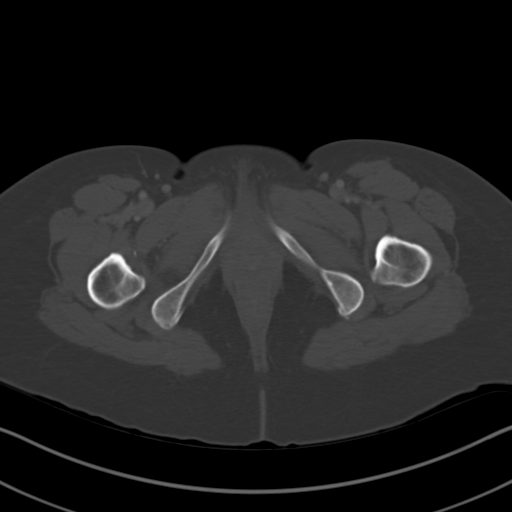
[im 11/87  soft-tissue]
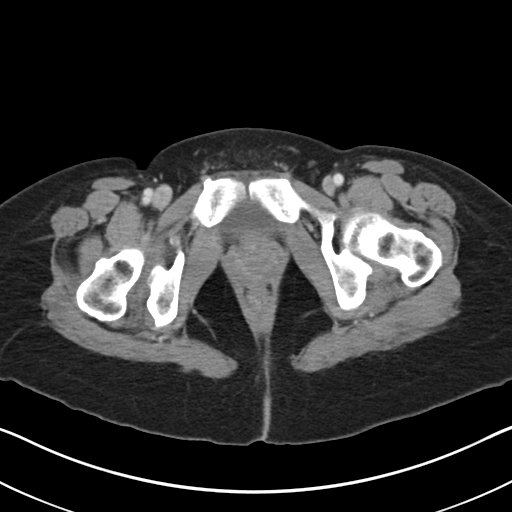
[im 21/87  soft-tissue]
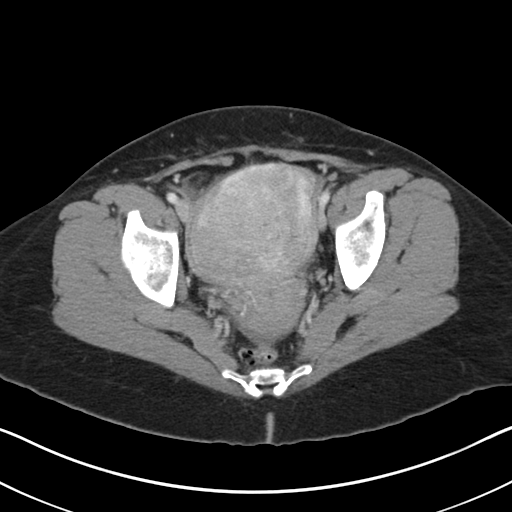
[im 26/87  soft-tissue]
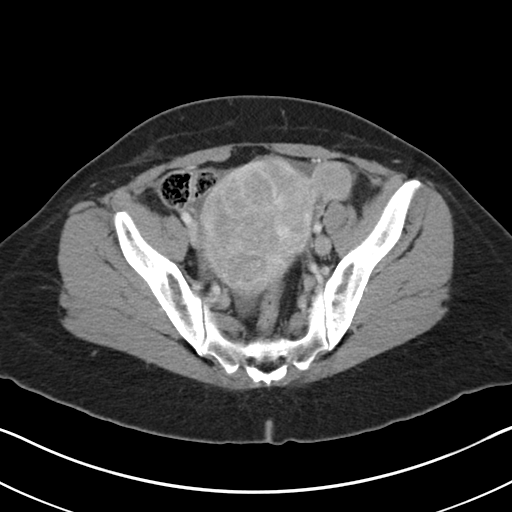
[im 31/87  soft-tissue]
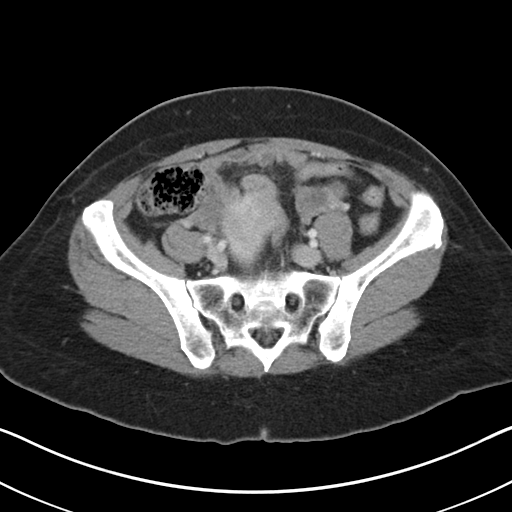
[im 36/87  soft-tissue]
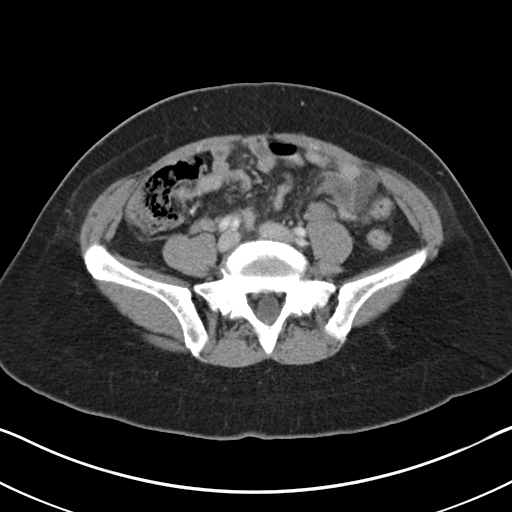
[im 46/87  soft-tissue]
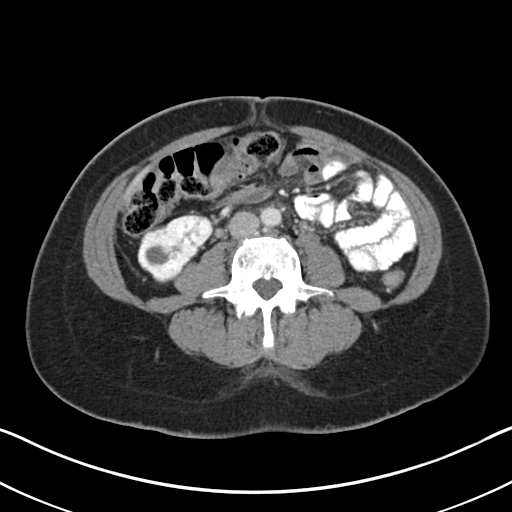
[im 51/87  soft-tissue]
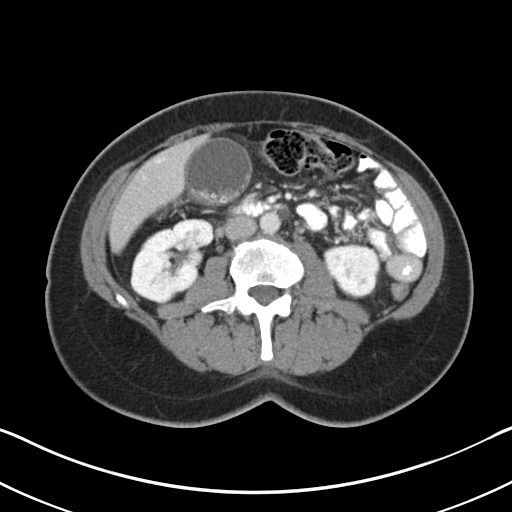
[im 56/87  soft-tissue]
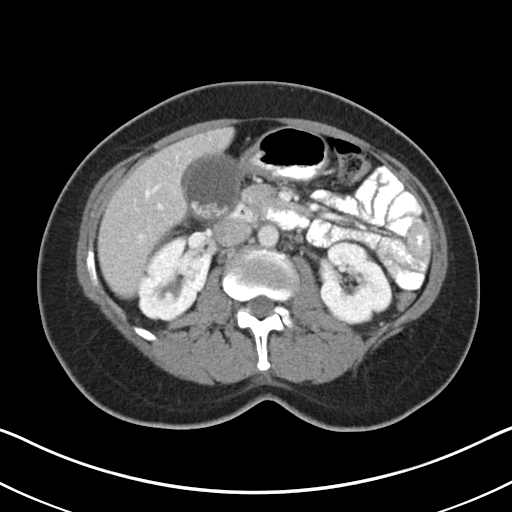
[im 56/87  bone]
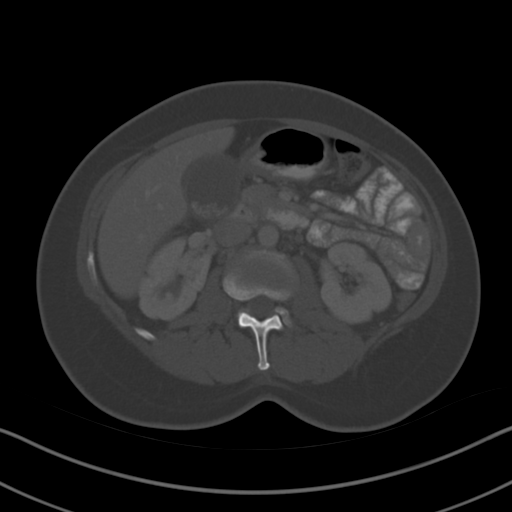
[im 61/87  soft-tissue]
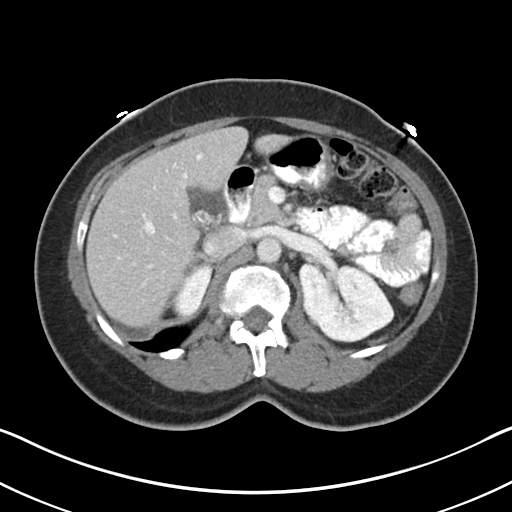
[im 66/87  soft-tissue]
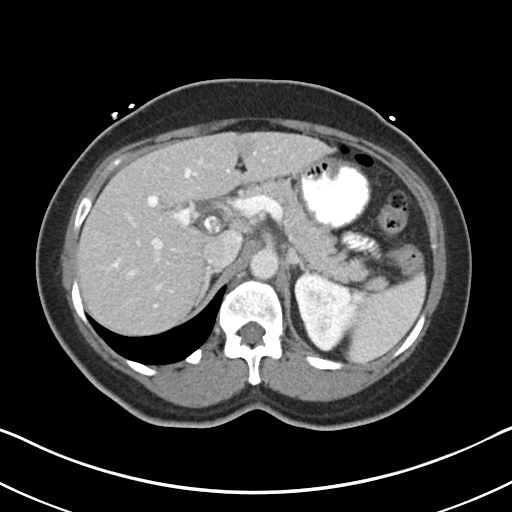
[im 76/87  soft-tissue]
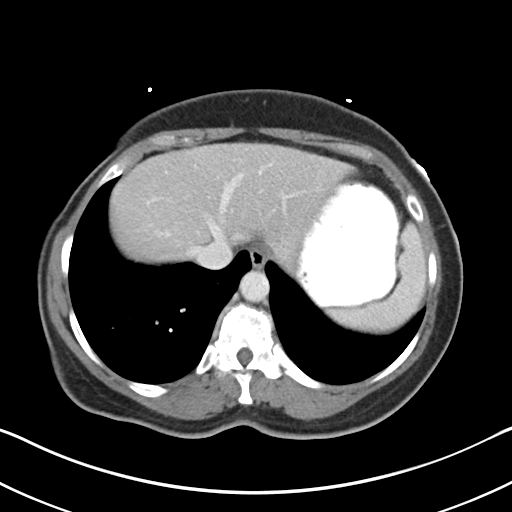
[im 81/87  soft-tissue]
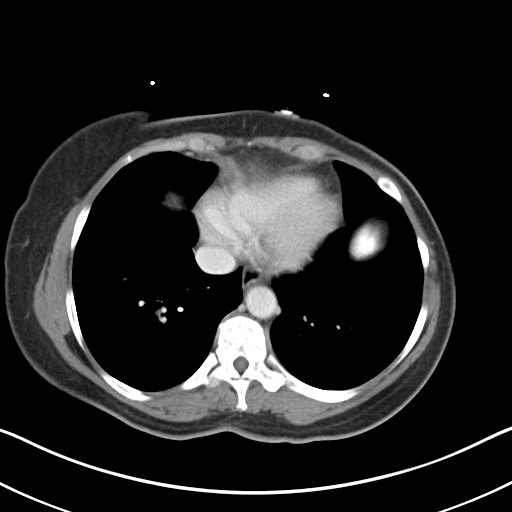

[Series 4: coronal soft tissue · coronal · 0.84mm/px · 3 of 115 slices shown]
[im 39/115  soft-tissue]
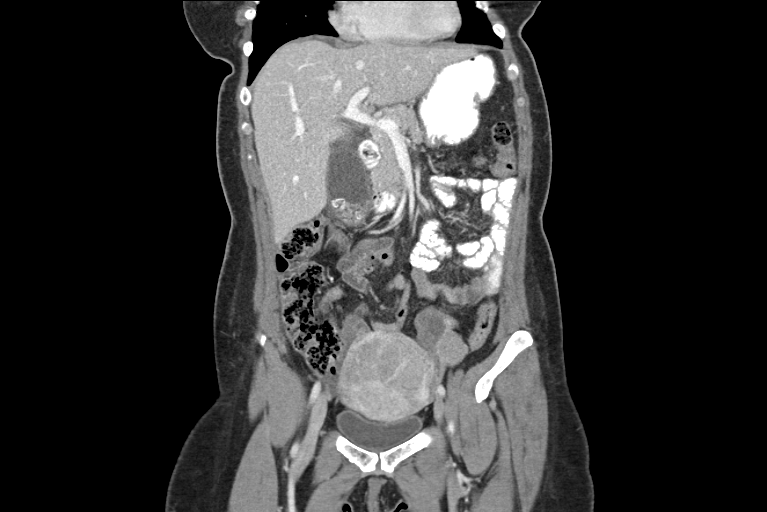
[im 51/115  soft-tissue]
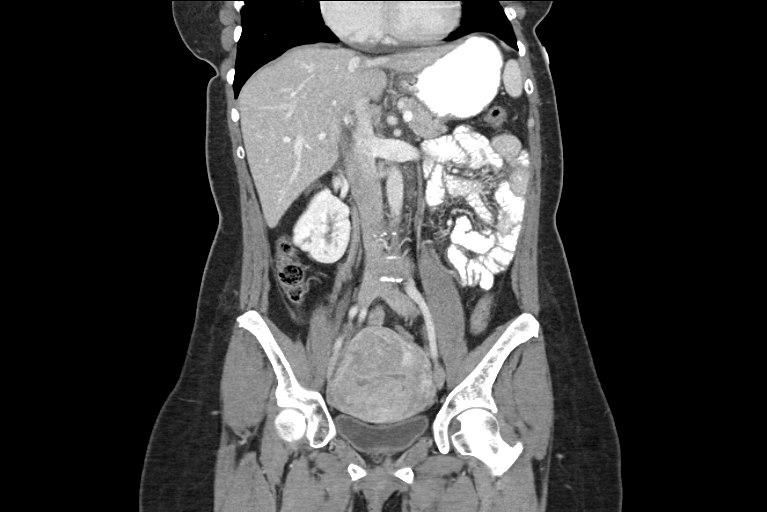
[im 64/115  soft-tissue]
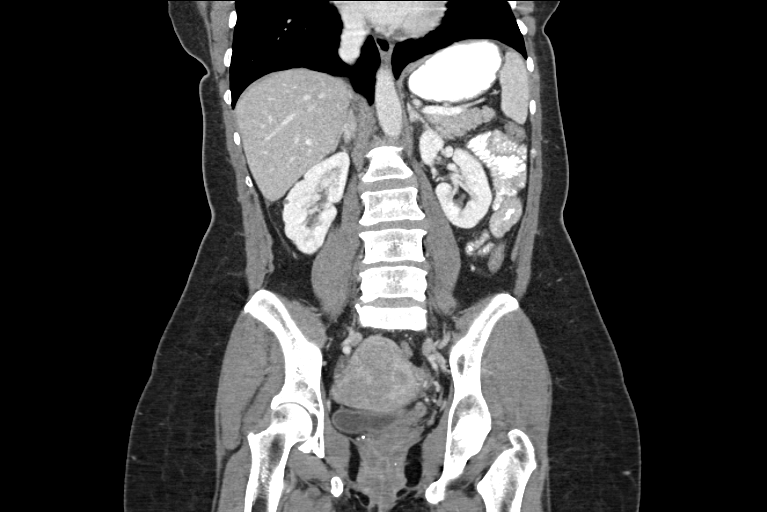

[16 of 46 positions shown; findings below may reference images not displayed]

FINDINGS: Visualized lung bases appear normal. No significant osseous
abnormality is noted.

Cholelithiasis is noted with minimal pericholecystic fluid seen. The
liver, spleen and pancreas appear normal. Adrenal glands appear
normal. Cortical scarring of right kidney is noted. Simple cyst is
seen in lower pole. No hydronephrosis or renal obstruction is noted.
There is no evidence of bowel obstruction.

Enlarged uterus is noted with multiple fibroids. The largest
measures 9.6 x 8.3 cm. 3.1 cm exophytic fibroid is node arising from
the left of the uterine fundus. Ovaries appear normal. No
significant adenopathy is noted. Urinary bladder appears normal.
IMPRESSION: Cholelithiasis is noted with minimal pericholecystic fluid. Further
evaluation with HIDA scan is recommended to evaluate for possible
cholecystitis.

Cortical scarring of right kidney is noted. No hydronephrosis or
renal obstruction is noted.

Uterus is significantly enlarged consistent with fibroid uterus. The
largest individual fibroid measures 9.6 cm in diameter.

## 2015-11-11 IMAGING — US US ABDOMEN LIMITED
1 series · 14 of 25 positions shown · non-contrast
Comparison: CT of the abdomen and pelvis performed earlier today at
[DATE] p.m.

CLINICAL DATA: Acute onset of right upper quadrant abdominal pain.
Initial encounter.

EXAM:
US ABDOMEN LIMITED - RIGHT UPPER QUADRANT

[Series 1: us abdomen limited · 0.26mm/px · 14 of 25 slices shown]
[im 1/25]
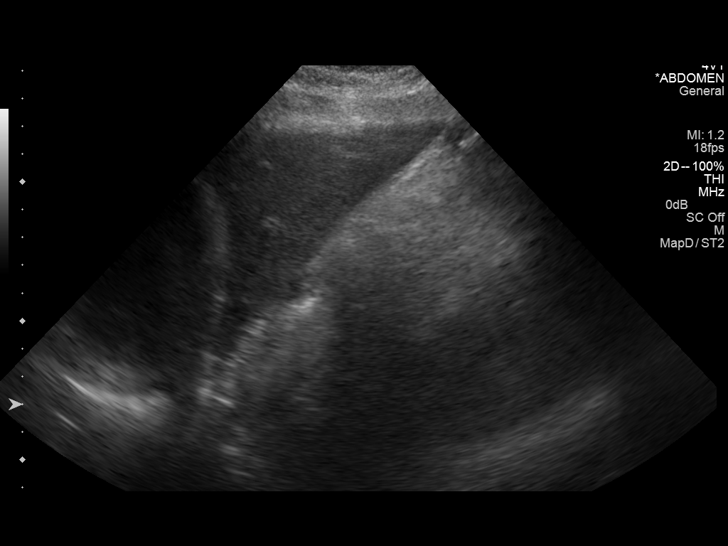
[im 3/25]
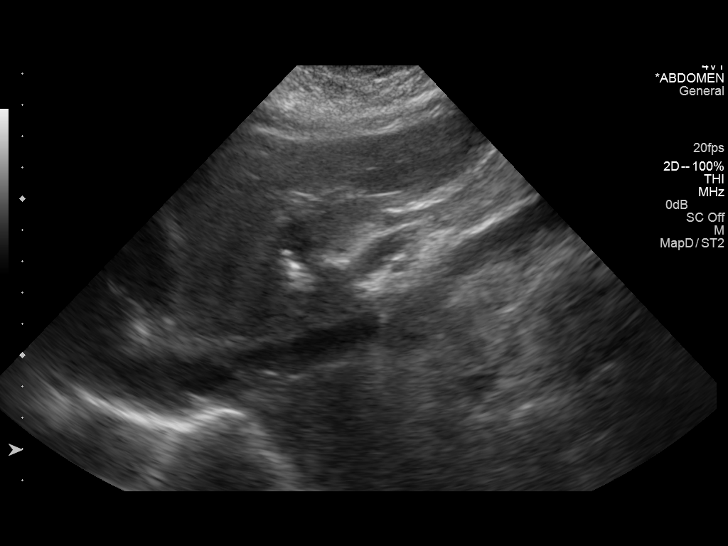
[im 5/25]
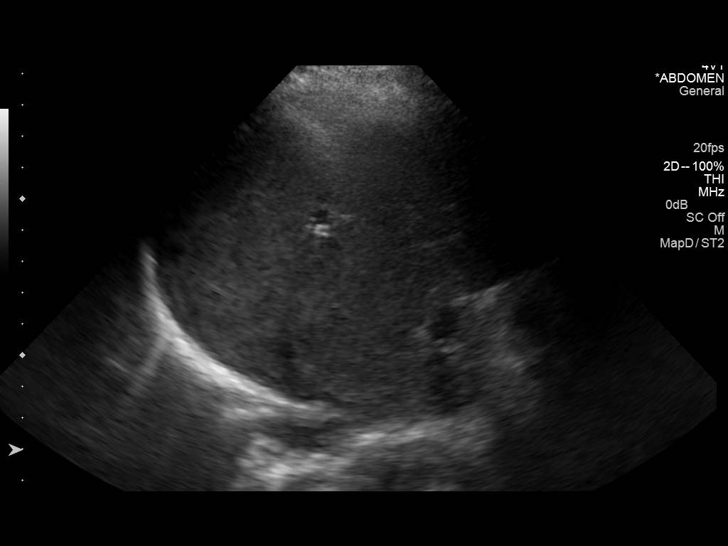
[im 7/25]
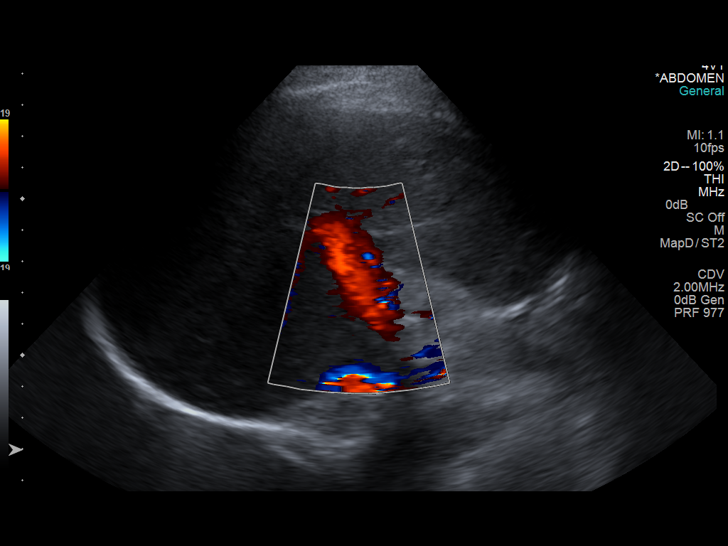
[im 9/25]
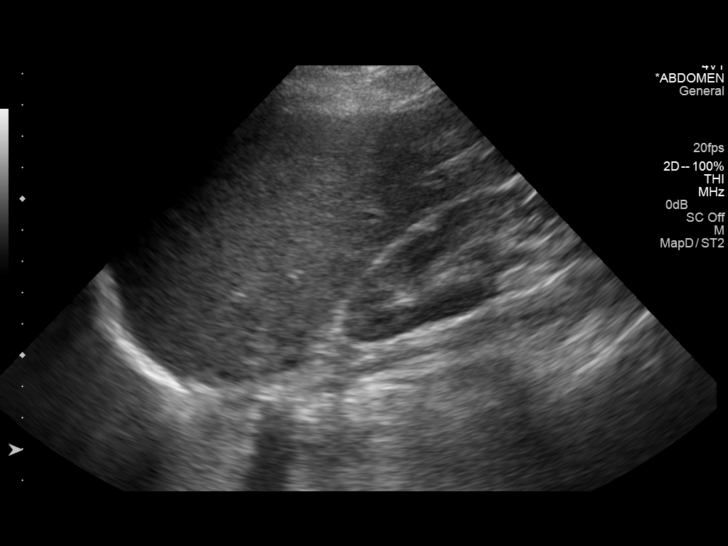
[im 10/25]
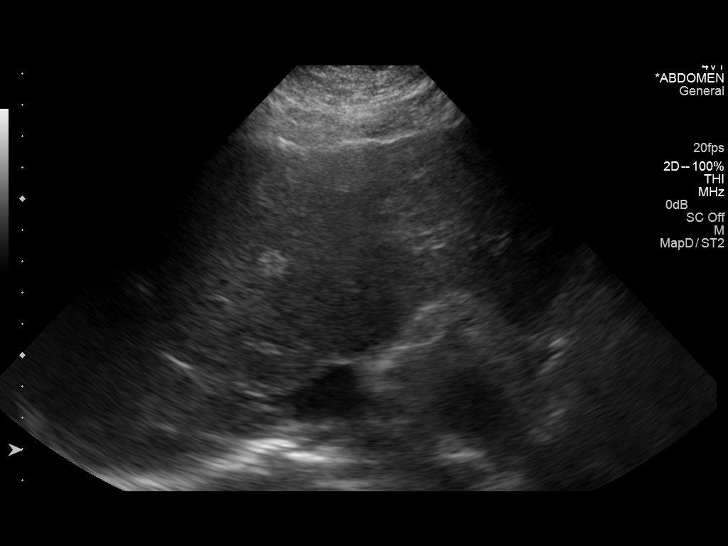
[im 12/25]
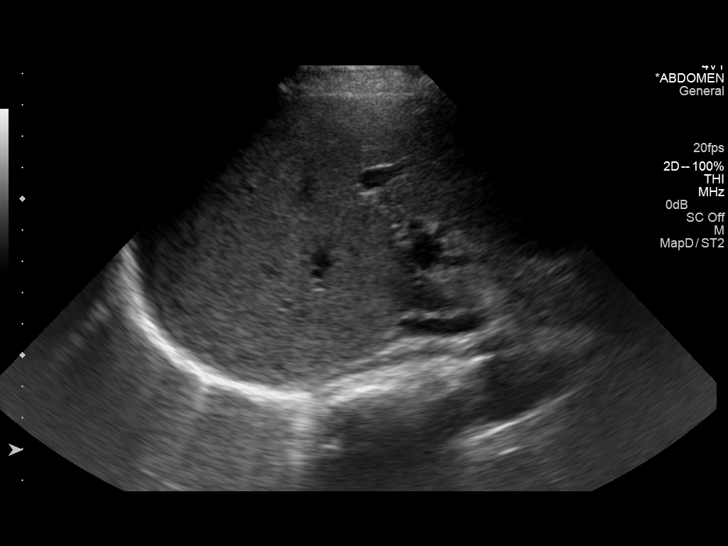
[im 14/25]
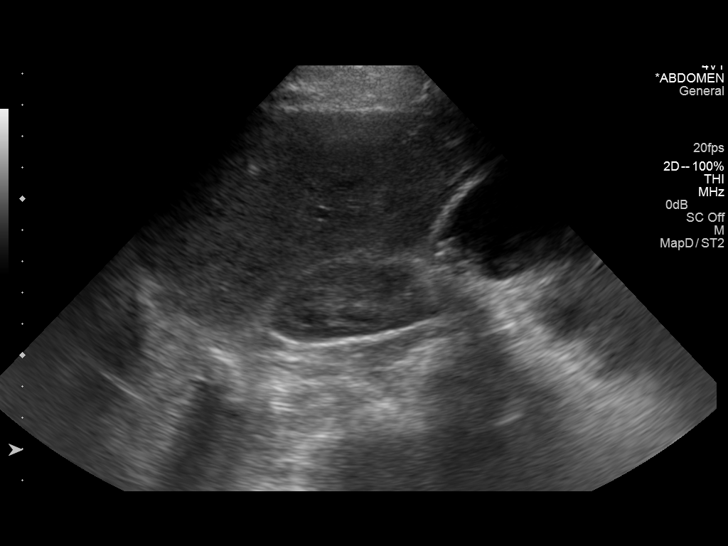
[im 16/25]
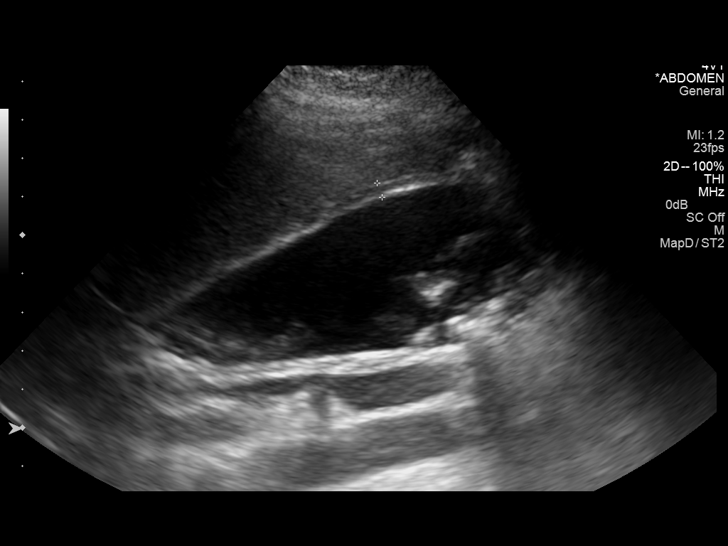
[im 17/25]
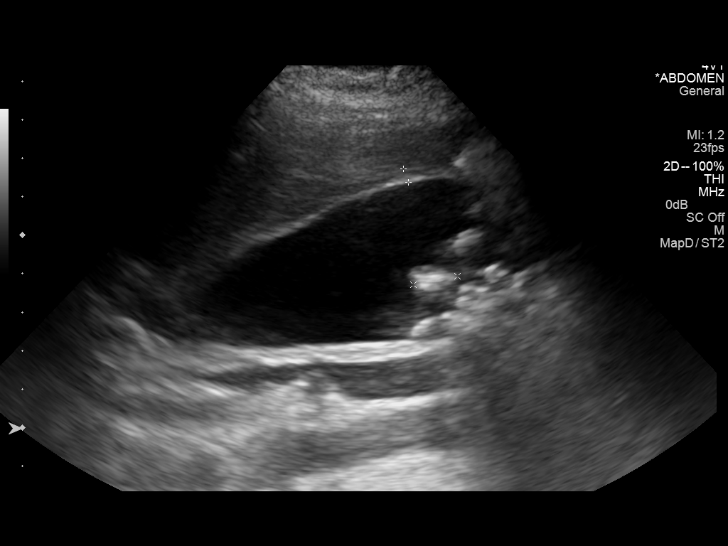
[im 19/25]
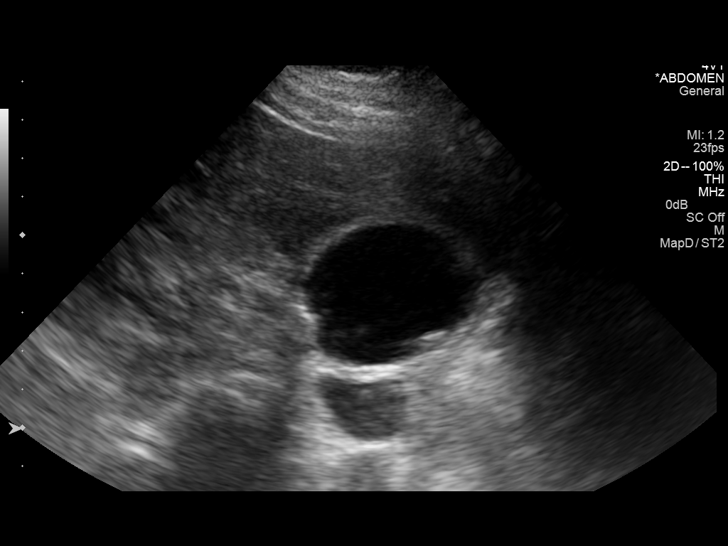
[im 21/25]
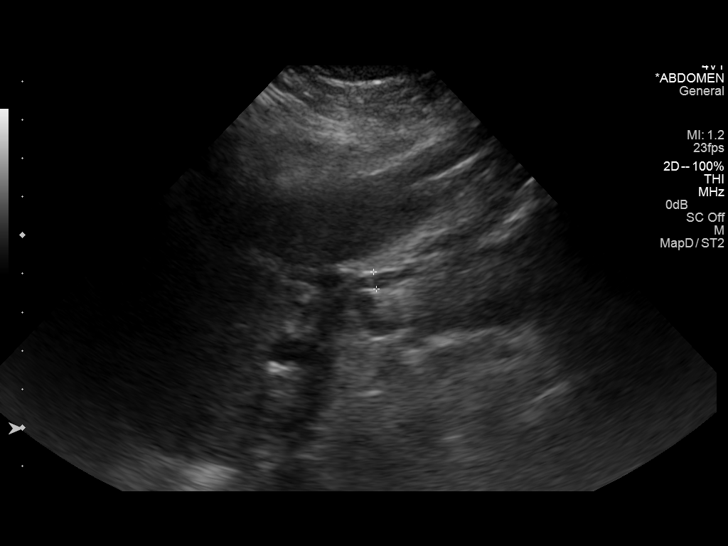
[im 23/25]
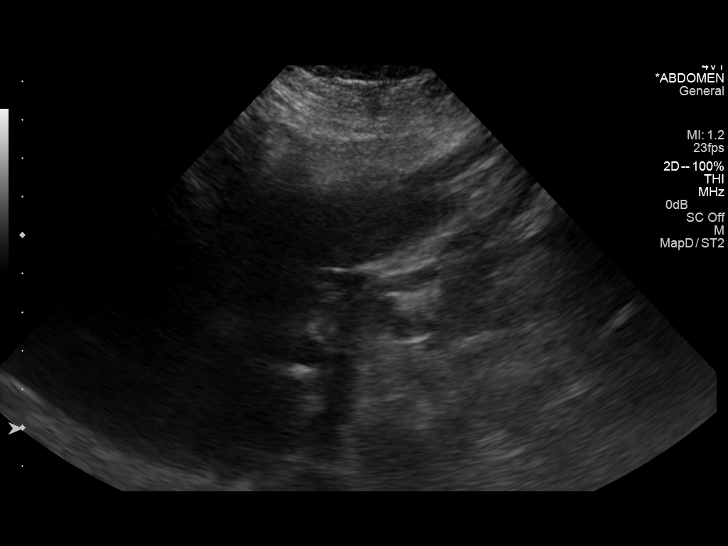
[im 25/25]
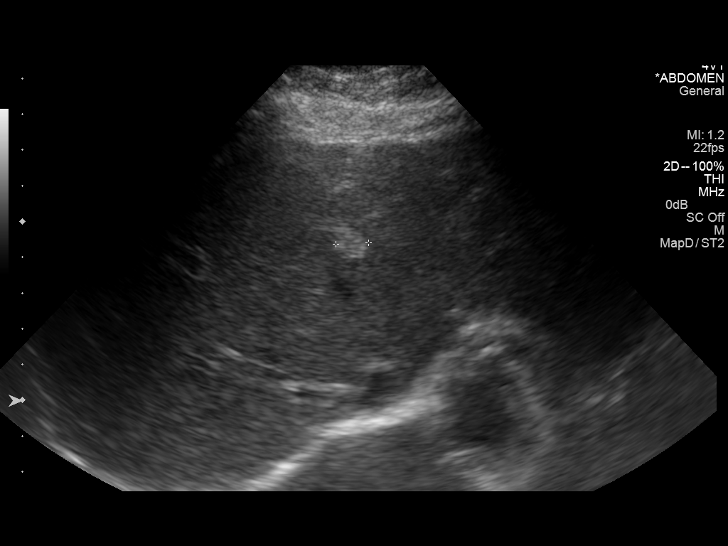

[14 of 25 positions shown; findings below may reference images not displayed]

FINDINGS: Gallbladder:

There is diffuse gallbladder wall thickening, measuring 0.4 cm, with
numerous stones dependently in the gallbladder. The gallbladder is
mildly distended. A positive ultrasonographic Murphy's sign is
elicited, raising concern for acute cholecystitis. No definite
pericholecystic fluid is seen.

Common bile duct:

Diameter: 0.5 cm, within normal limits in caliber.

Liver:

A 1.0 cm echogenic focus at the medial right hepatic lobe is thought
reflects a hemangioma, given its imaging characteristics. No
additional focal lesions are seen. The liver is otherwise
unremarkable in appearance.
IMPRESSION: 1. Suspect acute cholecystitis, with diffuse gallbladder wall
thickening, mild gallbladder distention and underlying
cholelithiasis. Positive ultrasonographic Murphy's sign elicited.
2. Likely 1.0 cm hemangioma incidentally noted within the medial
right hepatic lobe.

## 2022-09-03 ENCOUNTER — Encounter (HOSPITAL_COMMUNITY): Payer: Self-pay | Admitting: Emergency Medicine

## 2022-09-03 ENCOUNTER — Ambulatory Visit (HOSPITAL_COMMUNITY)
Admission: EM | Admit: 2022-09-03 | Discharge: 2022-09-03 | Disposition: A | Discharge status: Home / Self Care | Payer: BC Managed Care – PPO | Attending: Internal Medicine | Admitting: Internal Medicine

## 2022-09-03 ENCOUNTER — Other Ambulatory Visit: Payer: Self-pay

## 2022-09-03 DIAGNOSIS — R5383 Other fatigue: Secondary | ICD-10-CM

## 2022-09-03 LAB — CBC WITH DIFFERENTIAL/PLATELET
Abs Immature Granulocytes: 0.03 10*3/uL (ref 0.00–0.07)
Basophils Absolute: 0 10*3/uL (ref 0.0–0.1)
Basophils Relative: 0 %
Eosinophils Absolute: 0 10*3/uL (ref 0.0–0.5)
Eosinophils Relative: 0 %
HCT: 47.5 % — ABNORMAL HIGH (ref 36.0–46.0)
Hemoglobin: 15.6 g/dL — ABNORMAL HIGH (ref 12.0–15.0)
Immature Granulocytes: 0 %
Lymphocytes Relative: 21 %
Lymphs Abs: 1.5 10*3/uL (ref 0.7–4.0)
MCH: 27.4 pg (ref 26.0–34.0)
MCHC: 32.8 g/dL (ref 30.0–36.0)
MCV: 83.3 fL (ref 80.0–100.0)
Monocytes Absolute: 0.3 10*3/uL (ref 0.1–1.0)
Monocytes Relative: 4 %
Neutro Abs: 5.3 10*3/uL (ref 1.7–7.7)
Neutrophils Relative %: 75 %
Platelets: 333 10*3/uL (ref 150–400)
RBC: 5.7 MIL/uL — ABNORMAL HIGH (ref 3.87–5.11)
RDW: 15.3 % (ref 11.5–15.5)
WBC: 7.2 10*3/uL (ref 4.0–10.5)
nRBC: 0 % (ref 0.0–0.2)

## 2022-09-03 LAB — IRON AND TIBC
Iron: 84 ug/dL (ref 28–170)
Saturation Ratios: 24 % (ref 10.4–31.8)
TIBC: 357 ug/dL (ref 250–450)
UIBC: 273 ug/dL

## 2022-09-03 LAB — FERRITIN: Ferritin: 64 ng/mL (ref 11–307)

## 2022-09-03 LAB — T4, FREE: Free T4: 1.08 ng/dL (ref 0.61–1.12)

## 2022-09-03 LAB — TSH: TSH: 0.946 u[IU]/mL (ref 0.350–4.500)

## 2022-09-03 NOTE — ED Provider Notes (Signed)
[place]   [id number] 09/03/22 Arrival Time: 1103  ASSESSMENT & PLAN:  1. Fatigue, unspecified type    -This is an acute uncomplicated problem.  Given her history of anemia, will check CBC and iron studies.  We will also check thyroid levels.  We will follow-up with her by phone regarding the results.  Encouraged follow-up with primary care physician to discuss next steps.  All questions answered and she agrees to plan.  No orders of the defined types were placed in this encounter.  Discharge Instructions   None       Reviewed expectations re: course of current medical issues. Questions answered. Outlined signs and symptoms indicating need for more acute intervention. Patient verbalized understanding. After Visit Summary given.   SUBJECTIVE: Pleasant 61 year old female presents urgent care to be evaluated for fatigue.  She says she has felt fatigued more so than usual over the last 1 week.  Denies any changes to her daily activities or routine.  She gets about 8 hours of sleep during the night but admits she has been waking up occasionally in the middle the night this week.  She denies any  decreased mood.  She still has good appetite.  She does have a history of anemia.  She used to take iron pills in the past but now she only takes daily multivitamin.  She has had pica, craving ice chips, in the past as well, however denies that sensation currently.  Denies any bleeding.  Denies any hair loss or weight gain.  Denies any history of autoimmune problems.  Takes no other medications.  No LMP recorded. Patient is postmenopausal. Past Surgical History:  Procedure Laterality Date   CHOLECYSTECTOMY N/A 09/16/2014   Procedure: LAPAROSCOPIC CHOLECYSTECTOMY WITHOUT INTRAOPERATIVE CHOLANGIOGRAM;  Surgeon: Doreen Salvage, MD;  Location: [facility];  Service: General;  Laterality: N/A;     OBJECTIVE:  Vitals:   09/03/22 1029 09/03/22 1032  BP: (!) 166/106 (!) 140/108  Pulse:  (!) 104   Resp: 18   Temp: 98.6 F (37 C)   TempSrc: Oral   SpO2: 100%      Physical Exam Vitals reviewed.  Constitutional:      Appearance: Normal appearance.  HENT:     Head: Normocephalic.  Cardiovascular:     Rate and Rhythm: Normal rate.     Heart sounds: Murmur (holosystolic hear best at aortic position) heard.  Neurological:     Mental Status: She is alert.      Labs: Results for orders placed or performed during the hospital encounter of 09/15/14  Surgical pcr screen   Specimen: Nasal Mucosa; Nasal Swab  Result Value Ref Range   MRSA, PCR NEGATIVE NEGATIVE   Staphylococcus aureus NEGATIVE NEGATIVE  CBC WITH DIFFERENTIAL  Result Value Ref Range   WBC 10.8 (H) 4.0 - 10.5 K/uL   RBC 5.24 (H) 3.87 - 5.11 MIL/uL   Hemoglobin 13.9 12.0 - 15.0 g/dL   HCT 41.8 36.0 - 46.0 %   MCV 79.8 78.0 - 100.0 fL   MCH 26.5 26.0 - 34.0 pg   MCHC 33.3 30.0 - 36.0 g/dL   RDW 15.6 (H) 11.5 - 15.5 %   Platelets 262 150 - 400 K/uL   Neutrophils Relative % 82 (H) 43 - 77 %   Neutro Abs 8.9 (H) 1.7 - 7.7 K/uL   Lymphocytes Relative 11 (L) 12 - 46 %   Lymphs Abs 1.1 0.7 - 4.0 K/uL   Monocytes Relative 7 3 -  12 %   Monocytes Absolute 0.7 0.1 - 1.0 K/uL   Eosinophils Relative 0 0 - 5 %   Eosinophils Absolute 0.0 0.0 - 0.7 K/uL   Basophils Relative 0 0 - 1 %   Basophils Absolute 0.0 0.0 - 0.1 K/uL  Urinalysis with microscopic  Result Value Ref Range   Color, Urine YELLOW YELLOW   APPearance CLEAR CLEAR   Specific Gravity, Urine 1.006 1.005 - 1.030   pH 7.5 5.0 - 8.0   Glucose, UA NEGATIVE NEGATIVE mg/dL   Hgb urine dipstick NEGATIVE NEGATIVE   Bilirubin Urine NEGATIVE NEGATIVE   Ketones, ur 15 (A) NEGATIVE mg/dL   Protein, ur NEGATIVE NEGATIVE mg/dL   Urobilinogen, UA 0.2 0.0 - 1.0 mg/dL   Nitrite NEGATIVE NEGATIVE   Leukocytes, UA NEGATIVE NEGATIVE  Comprehensive metabolic panel  Result Value Ref Range   Sodium 138 137 - 147 mEq/L   Potassium 3.7 3.7 - 5.3 mEq/L    Chloride 100 96 - 112 mEq/L   CO2 22 19 - 32 mEq/L   Glucose, Bld 111 (H) 70 - 99 mg/dL   BUN 5 (L) 6 - 23 mg/dL   Creatinine, Ser 0.53 0.50 - 1.10 mg/dL   Calcium 9.6 8.4 - 10.5 mg/dL   Total Protein 8.6 (H) 6.0 - 8.3 g/dL   Albumin 3.9 3.5 - 5.2 g/dL   AST 17 0 - 37 U/L   ALT 8 0 - 35 U/L   Alkaline Phosphatase 101 39 - 117 U/L   Total Bilirubin 0.6 0.3 - 1.2 mg/dL   GFR calc non Af Amer >90 >90 mL/min   GFR calc Af Amer >90 >90 mL/min   Anion gap 16 (H) 5 - 15  Lipase, blood  Result Value Ref Range   Lipase 11 11 - 59 U/L  Troponin I  Result Value Ref Range   Troponin I <0.30 <0.30 ng/mL   Labs Reviewed  CBC WITH DIFFERENTIAL/PLATELET  IRON AND TIBC  FERRITIN  TSH  T4, FREE    Imaging: No results found.   No Known Allergies                                             Past Medical History:  Diagnosis Date   Seizures ([place])     Social History   Socioeconomic History   Marital status: Single    Spouse name: Not on file   Number of children: Not on file   Years of education: Not on file   Highest education level: Not on file  Occupational History   Not on file  Tobacco Use   Smoking status: Never   Smokeless tobacco: Not on file  Vaping Use   Vaping Use: Never used  Substance and Sexual Activity   Alcohol use: Yes   Drug use: Not on file   Sexual activity: Not on file  Other Topics Concern   Not on file  Social History Narrative   Not on file   Social Determinants of Health   Financial Resource Strain: Not on file  Food Insecurity: Not on file  Transportation Needs: Not on file  Physical Activity: Not on file  Stress: Not on file  Social Connections: Not on file  Intimate Partner Violence: Not on file    History reviewed. No pertinent family history.    Diamon Reddinger, Dorian Pod, MD 09/03/22  1120  

## 2022-09-03 NOTE — ED Triage Notes (Signed)
Fatigue for one week.    Denies runny nose, cough and no fever.   May have had one diarrhea bowel movement.  Denies pain  Denies being around anyone that is sick

## 2022-09-29 ENCOUNTER — Ambulatory Visit (HOSPITAL_COMMUNITY)
Admission: EM | Admit: 2022-09-29 | Discharge: 2022-09-29 | Disposition: A | Discharge status: Home / Self Care | Payer: BC Managed Care – PPO

## 2022-09-29 DIAGNOSIS — R5383 Other fatigue: Secondary | ICD-10-CM | POA: Diagnosis not present

## 2022-09-29 DIAGNOSIS — R000 Tachycardia, unspecified: Secondary | ICD-10-CM

## 2022-09-29 NOTE — ED Provider Notes (Signed)
[facility]    CSN: [id number] Arrival date & time: 09/29/22  0855      History   Chief Complaint Chief Complaint  Patient presents with   Fatigue    HPI TAESHA GOODELL is a 61 y.o. female. Pt was seen in UC 09/03/22 for fatigue. No anemia or thyroid abnormalities on labs for that visit. As fatigue continued, someone suggested she test for covid and she tested positive 09/15/22. Pt retook covid test at home on 09/22/2022 and it was negative . Pt continues with fatigue; she describes fatigue as "having low energy." Denies chest pain, SOB, dizziness, syncope or feeling like she might pass out, palpitations, LE edema. Having COVID did impact her sense of taste but not completely - some things still taste normal. Has been having one stool a day and it is loose but not liquid since being sick. Today also felt nauseated this morning but no vomiting. Has not eaten today. Denies fever or chills. Denies difficulty concentrating.    HPI  Past Medical History:  Diagnosis Date   Seizures [facility])     Patient Active Problem List   Diagnosis Date Noted   Status post laparoscopic cholecystectomy Nov 2015 09/17/2014    Past Surgical History:  Procedure Laterality Date   CHOLECYSTECTOMY N/A 09/16/2014   Procedure: LAPAROSCOPIC CHOLECYSTECTOMY WITHOUT INTRAOPERATIVE CHOLANGIOGRAM;  Surgeon: Doreen Salvage, MD;  Location: [facility];  Service: General;  Laterality: N/A;    OB History   No obstetric history on file.      Home Medications    Prior to Admission medications   Medication Sig Start Date End Date Taking? Authorizing Provider  oxyCODONE-acetaminophen (PERCOCET/ROXICET) 5-325 MG per tablet Take 1-2 tablets by mouth every 4 (four) hours as needed for moderate pain. Patient not taking: Reported on 09/03/2022 09/17/14   Johnathan Hausen, MD    Family History No family history on file.  Social History Social History   Tobacco Use   Smoking status: Never  Vaping Use    Vaping Use: Never used  Substance Use Topics   Alcohol use: Yes     Allergies   Patient has no known allergies.   Review of Systems Review of Systems   Physical Exam Triage Vital Signs ED Triage Vitals [09/29/22 1113]  Enc Vitals Group     BP (!) 141/105     Pulse Rate (!) 110     Resp 12     Temp 98.8 F (37.1 C)     Temp Source Oral     SpO2 96 %     Weight      Height      Head Circumference      Peak Flow      Pain Score      Pain Loc      Pain Edu?      Excl. in [place]?    No data found.  Updated Vital Signs BP (!) 141/105 (BP Location: Left Arm)   Pulse (!) 110   Temp 98.8 F (37.1 C) (Oral)   Resp 12   SpO2 96%   Visual Acuity Right Eye Distance:   Left Eye Distance:   Bilateral Distance:    Right Eye Near:   Left Eye Near:    Bilateral Near:     Physical Exam Constitutional:      General: She is not in acute distress.    Appearance: Normal appearance. She is not ill-appearing.  Cardiovascular:     Rate  and Rhythm: Regular rhythm. Tachycardia present.     Heart sounds: Normal heart sounds.     Comments: A murmur was heard at 09/03/22 urgent care visit. I do not hear murmur today.  Pulmonary:     Effort: Pulmonary effort is normal.     Breath sounds: Normal breath sounds.  Abdominal:     General: Abdomen is flat. Bowel sounds are normal. There is no distension.     Tenderness: There is no abdominal tenderness. There is no guarding or rebound.  Neurological:     Mental Status: She is alert.      UC Treatments / Results  Labs (all labs ordered are listed, but only abnormal results are displayed) Labs Reviewed - No data to display  EKG   Radiology No results found.  Procedures Procedures (including critical care time)  Medications Ordered in UC Medications - No data to display  Initial Impression / Assessment and Plan / UC Course  I have reviewed the triage vital signs and the nursing notes.  Pertinent labs & imaging results  that were available during my care of the patient were reviewed by me and considered in my medical decision making (see chart for details).    Pt's fatigue is not debilitating - she can work and do her daily activities but she has less energy than she used to. Hard to know when she contracted COVID - she tested positive 11/27 but she thinks she likely had it during her 09/03/22 UC visit. We discussed on COVID can cause prolonged fatigue in some people. She is also tachycardic today. Review of vital signs from 11/15 visit show she was slightly tachycardic at that visit also at 104bpm. I do not hear a murmur today but one was noted 09/03/22. Pt does not have a PCP - we will help her arrange a new pt appointment with one before she leaves today.   Discussed red flag reasons for seeking ER care such as chest pain, SOB, palpitations, feeling like she might pass out, intractable vomiting.   Final Clinical Impressions(s) / UC Diagnoses   Final diagnoses:  Fatigue, unspecified type  Tachycardia     Discharge Instructions      Talk with a primary care provider about your fatigue and your rapid heart rate.    ED Prescriptions   None    PDMP not reviewed this encounter.   Carvel Getting, NP 09/29/22 1155

## 2022-09-29 NOTE — Discharge Instructions (Signed)
Talk with a primary care provider about your fatigue and your rapid heart rate.

## 2022-09-29 NOTE — ED Triage Notes (Signed)
Pt has been sick x2wks, pt continues not to feel good. Pt took a take home covid test  09/15/22 and tested positive . Pt retook covid test at home on 09/22/2022 and it was  negative . Pt continues with extreme fatigue

## 2022-11-26 ENCOUNTER — Ambulatory Visit (INDEPENDENT_AMBULATORY_CARE_PROVIDER_SITE_OTHER): Payer: BC Managed Care – PPO | Admitting: Nurse Practitioner

## 2022-11-26 ENCOUNTER — Encounter: Payer: Self-pay | Admitting: Nurse Practitioner

## 2022-11-26 ENCOUNTER — Other Ambulatory Visit (HOSPITAL_COMMUNITY)
Admission: RE | Admit: 2022-11-26 | Discharge: 2022-11-26 | Disposition: A | Discharge status: Home / Self Care | Payer: BC Managed Care – PPO | Source: Ambulatory Visit | Attending: Nurse Practitioner | Admitting: Nurse Practitioner

## 2022-11-26 VITALS — BP 138/88 | HR 96 | Temp 97.8°F | Resp 16 | Ht 60.0 in | Wt 145.4 lb

## 2022-11-26 DIAGNOSIS — Z1211 Encounter for screening for malignant neoplasm of colon: Secondary | ICD-10-CM | POA: Diagnosis not present

## 2022-11-26 DIAGNOSIS — E663 Overweight: Secondary | ICD-10-CM | POA: Diagnosis not present

## 2022-11-26 DIAGNOSIS — Z0000 Encounter for general adult medical examination without abnormal findings: Secondary | ICD-10-CM

## 2022-11-26 DIAGNOSIS — Z124 Encounter for screening for malignant neoplasm of cervix: Secondary | ICD-10-CM | POA: Insufficient documentation

## 2022-11-26 DIAGNOSIS — R5383 Other fatigue: Secondary | ICD-10-CM

## 2022-11-26 DIAGNOSIS — Z1231 Encounter for screening mammogram for malignant neoplasm of breast: Secondary | ICD-10-CM

## 2022-11-26 LAB — LIPID PANEL
Cholesterol: 247 mg/dL — ABNORMAL HIGH (ref 0–200)
HDL: 74 mg/dL (ref >39.00)
LDL Cholesterol: 151 mg/dL — ABNORMAL HIGH (ref 0–99)
NonHDL: 172.92
Total CHOL/HDL Ratio: 3
Triglycerides: 110 mg/dL (ref 0.0–149.0)
VLDL: 22 mg/dL (ref 0.0–40.0)

## 2022-11-26 LAB — COMPREHENSIVE METABOLIC PANEL
ALT: 28 U/L (ref 0–35)
AST: 20 U/L (ref 0–37)
Albumin: 4.3 g/dL (ref 3.5–5.2)
Alkaline Phosphatase: 62 U/L (ref 39–117)
BUN: 6 mg/dL (ref 6–23)
CO2: 29 mEq/L (ref 19–32)
Calcium: 9.2 mg/dL (ref 8.4–10.5)
Chloride: 103 mEq/L (ref 96–112)
Creatinine, Ser: 0.6 mg/dL (ref 0.40–1.20)
GFR: 96.73 mL/min (ref >60.00)
Glucose, Bld: 112 mg/dL — ABNORMAL HIGH (ref 70–99)
Potassium: 3.2 mEq/L — ABNORMAL LOW (ref 3.5–5.1)
Sodium: 144 mEq/L (ref 135–145)
Total Bilirubin: 1 mg/dL (ref 0.2–1.2)
Total Protein: 7.2 g/dL (ref 6.0–8.3)

## 2022-11-26 LAB — HEMOGLOBIN A1C: Hgb A1c MFr Bld: 6 % (ref 4.6–6.5)

## 2022-11-26 LAB — VITAMIN B12: Vitamin B-12: 264 pg/mL (ref 211–911)

## 2022-11-26 LAB — CBC
HCT: 40.7 % (ref 36.0–46.0)
Hemoglobin: 13.3 g/dL (ref 12.0–15.0)
MCHC: 32.6 g/dL (ref 30.0–36.0)
MCV: 84.2 fl (ref 78.0–100.0)
Platelets: 314 10*3/uL (ref 150.0–400.0)
RBC: 4.83 Mil/uL (ref 3.87–5.11)
RDW: 16.1 % — ABNORMAL HIGH (ref 11.5–15.5)
WBC: 6.1 10*3/uL (ref 4.0–10.5)

## 2022-11-26 LAB — VITAMIN D 25 HYDROXY (VIT D DEFICIENCY, FRACTURES): VITD: 13.61 ng/mL — ABNORMAL LOW (ref 30.00–100.00)

## 2022-11-26 LAB — TSH: TSH: 0.92 u[IU]/mL (ref 0.35–5.50)

## 2022-11-26 NOTE — Progress Notes (Signed)
New Patient Office Visit  Subjective    Patient ID: Jasmine Lester, female    DOB: [birth year]  Age: 62 y.o. MRN: [id number]  CC:  Chief Complaint  Patient presents with   Establish Care    HPI Jasmine Lester presents to establish care  for complete physical and follow up of chronic conditions.  Seizures: States in her history. States that she woke up coming out on stretcher. No longer on medications. States was followed by Dr. Erling Cruz. No longer followed     Immunizations: -Tetanus: Completed in unsure -Influenza: refused -Shingles: Completed Shingrix series -Pneumonia: Too young Covid: pfizer  -HPV: Aged out  Diet: [place]. States 2 meals a day generally. States that she will snack some. She will drink water, tea, and soda. States half water and half soda intake. Normal soda Exercise: No regular exercise. Work   Eye exam: Completes annually. Wear glasses  Dental exam: Completes semi-annually  has a top plate  Pap Smear:   needs updating Mammogram:  needs updating.  [place]  Colonoscopy: Completed in over 10 years. Needs referral  Lung Cancer Screening: N/A Dexa: N/A   Sleep: States she gets up at 707 and goes to bed aroun 930-10. Feels rested when she gets up. States that does snore   Numbness. Right hand for several weeks. No injury.  No neck injury.  States it is just the hand on the fingers that is intermittent.  States has been approximately 3 weeks Outpatient Encounter Medications as of 11/26/2022  Medication Sig   Multiple Vitamin (MULTIVITAMIN) capsule Take 1 capsule by mouth daily.   oxyCODONE-acetaminophen (PERCOCET/ROXICET) 5-325 MG per tablet Take 1-2 tablets by mouth every 4 (four) hours as needed for moderate pain. (Patient not taking: Reported on 09/03/2022)   No facility-administered encounter medications on file as of 11/26/2022.    Past Medical History:  Diagnosis Date   Seizures ([place])     Past Surgical History:  Procedure  Laterality Date   CHOLECYSTECTOMY N/A 09/16/2014   Procedure: LAPAROSCOPIC CHOLECYSTECTOMY WITHOUT INTRAOPERATIVE CHOLANGIOGRAM;  Surgeon: Doreen Salvage, MD;  Location: [facility];  Service: General;  Laterality: N/A;    Family History  Problem Relation Age of Onset   Hypertension Mother    Hypertension Maternal Grandmother    Hypertension Maternal Grandfather    Hypertension Paternal Grandmother    Hypertension Paternal Grandfather     Social History   Socioeconomic History   Marital status: Single    Spouse name: Not on file   Number of children: 1   Years of education: college   Highest education level: Not on file  Occupational History   Not on file  Tobacco Use   Smoking status: Never   Smokeless tobacco: Never  Vaping Use   Vaping Use: Never used  Substance and Sexual Activity   Alcohol use: Yes    Comment: occ   Drug use: Not on file   Sexual activity: Not on file  Other Topics Concern   Not on file  Social History Narrative   [employer] 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 Negative for chills and fever.  Respiratory:  Negative for shortness of breath.   Cardiovascular:  Negative for chest pain and leg swelling.  Gastrointestinal:  Negative for abdominal pain, blood in stool, constipation, diarrhea, nausea and vomiting.       BM daily   Genitourinary:  Negative for dysuria and hematuria.  Neurological:  Positive for tingling. Negative for headaches.  Psychiatric/Behavioral:  Negative for hallucinations and suicidal ideas.         Objective    BP 138/88   Pulse 96    Temp 97.8 F (36.6 C)   Resp 16   Ht 5' (1.524 m)   Wt 145 lb 6 oz (65.9 kg)   SpO2 99%   BMI 28.39 kg/m   Physical Exam Vitals and nursing note reviewed. Exam conducted with a chaperone present Claiborne Billings [place], CMA).  Constitutional:      Appearance: Normal appearance.  HENT:     Right Ear: Tympanic membrane, ear canal and external ear normal.     Left Ear: Tympanic membrane, ear canal and external ear normal.     Mouth/Throat:     Mouth: Mucous membranes are moist.     Pharynx: Oropharynx is clear.  Eyes:     Extraocular Movements: Extraocular movements intact.     Pupils: Pupils are equal, round, and reactive to light.  Cardiovascular:     Rate and Rhythm: Normal rate and regular rhythm.     Pulses: Normal pulses.     Heart sounds: Normal heart sounds.  Pulmonary:     Effort: Pulmonary effort is normal.     Breath sounds: Normal breath sounds.  Abdominal:     General: Bowel sounds are normal. There is no distension.     Palpations: There is no mass.     Tenderness: There is no abdominal tenderness.     Hernia: No hernia is present.  Genitourinary:    Exam position: Lithotomy position.     Vagina: Normal.     Cervix: Normal.     Uterus: Normal.      Adnexa: Right adnexa normal and left adnexa normal.  Musculoskeletal:     Right lower leg: No edema.     Left lower leg: No edema.  Lymphadenopathy:     Cervical: No cervical adenopathy.  Skin:    General: Skin is warm.  Neurological:     General: No focal deficit present.     Mental Status: She is alert.     Deep Tendon Reflexes:     Reflex Scores:      Bicep reflexes are 2+ on the right side and 2+ on the left side.      Patellar reflexes are 2+ on the right side and 2+ on the left side.    Comments: Bilateral upper and lower extremity strength 5/5  Psychiatric:        Mood and Affect: Mood normal.        Behavior: Behavior normal.        Thought Content: Thought content normal.        Judgment: Judgment  normal.         Assessment & Plan:   Problem List Items Addressed This Visit       Other   Other fatigue    Ambiguous and overarching since patient had COVID.  Has had recent CBC and TSH about 3 to 4 months  ago that was normal.  Will recheck and add on vitamin B12 and vitamin D.  Pending results      Relevant Orders   Vitamin B12   TSH   VITAMIN D 25 Hydroxy (Vit-D Deficiency, Fractures)   Overweight    Work on healthy lifestyle modifications      Relevant Orders   Lipid panel   Hemoglobin A1c   Preventative health care - Primary    Discussed age-appropriate disease screening exams.  Patient refused influenza vaccine notified about the shingles vaccine.  She is given information about preventative healthcare maintenance for her age range along with anticipatory guidance.  Cervical cancer screening done in office.  Amatory referral for CRC screening.  Order placed for mammogram for breast cancer screening.      Relevant Orders   CBC   Lipid panel   Comprehensive metabolic panel   Other Visit Diagnoses     Screening for colon cancer       Relevant Orders   Ambulatory referral to Gastroenterology   Cervical cancer screening       Relevant Orders   Cytology - PAP([facility] [place])   Screening mammogram for breast cancer       Relevant Orders   MM 3D SCREEN BREAST BILATERAL       Return in about 1 year (around 11/27/2023) for CPE and Labs.   Romilda Garret, NP

## 2022-11-26 NOTE — Patient Instructions (Signed)
Nice to see you today I will be in touch with the labs once I have them I want to see you every year for your physical, sooner if you need me Think about getting the shingles vaccine (shingrix)

## 2022-11-26 NOTE — Assessment & Plan Note (Signed)
Discussed age-appropriate disease screening exams.  Patient refused influenza vaccine notified about the shingles vaccine.  She is given information about preventative healthcare maintenance for her age range along with anticipatory guidance.  Cervical cancer screening done in office.  Amatory referral for CRC screening.  Order placed for mammogram for breast cancer screening.

## 2022-11-26 NOTE — Assessment & Plan Note (Signed)
Ambiguous and overarching since patient had COVID.  Has had recent CBC and TSH about 3 to 4 months ago that was normal.  Will recheck and add on vitamin B12 and vitamin D.  Pending results

## 2022-11-26 NOTE — Assessment & Plan Note (Signed)
Work on healthy lifestyle modifications

## 2022-11-27 ENCOUNTER — Other Ambulatory Visit: Payer: Self-pay | Admitting: Nurse Practitioner

## 2022-11-27 DIAGNOSIS — R7303 Prediabetes: Secondary | ICD-10-CM | POA: Insufficient documentation

## 2022-11-27 DIAGNOSIS — E559 Vitamin D deficiency, unspecified: Secondary | ICD-10-CM | POA: Insufficient documentation

## 2022-11-27 DIAGNOSIS — E7800 Pure hypercholesterolemia, unspecified: Secondary | ICD-10-CM

## 2022-11-27 MED ORDER — VITAMIN D (ERGOCALCIFEROL) 1.25 MG (50000 UNIT) PO CAPS: 50000.0000 [IU] | ORAL_CAPSULE | ORAL | 0 refills | Status: DC

## 2022-11-28 ENCOUNTER — Other Ambulatory Visit: Payer: Self-pay | Admitting: Nurse Practitioner

## 2022-11-28 ENCOUNTER — Telehealth: Payer: Self-pay

## 2022-11-28 DIAGNOSIS — E876 Hypokalemia: Secondary | ICD-10-CM

## 2022-11-28 MED ORDER — POTASSIUM CHLORIDE CRYS ER 20 MEQ PO TBCR: 40.0000 meq | EXTENDED_RELEASE_TABLET | Freq: Once | ORAL | 0 refills | Status: AC

## 2022-11-28 NOTE — Telephone Encounter (Signed)
Spoke to pt and gave her lab results. She stated that she only got 1 of the 2 rx because the other was not sent it. Jasmine Lester is aware and sending in this am.

## 2022-12-01 LAB — CYTOLOGY - PAP
Adequacy: ABSENT
Comment: NEGATIVE
Diagnosis: NEGATIVE
High risk HPV: NEGATIVE

## 2022-12-08 ENCOUNTER — Encounter: Payer: Self-pay | Admitting: *Deleted

## 2022-12-09 ENCOUNTER — Telehealth: Payer: Self-pay | Admitting: *Deleted

## 2022-12-09 ENCOUNTER — Other Ambulatory Visit: Payer: Self-pay | Admitting: *Deleted

## 2022-12-09 ENCOUNTER — Telehealth: Payer: Self-pay | Admitting: Nurse Practitioner

## 2022-12-09 DIAGNOSIS — Z1211 Encounter for screening for malignant neoplasm of colon: Secondary | ICD-10-CM

## 2022-12-09 MED ORDER — NA SULFATE-K SULFATE-MG SULF 17.5-3.13-1.6 GM/177ML PO SOLN: 1.0000 | Freq: Once | ORAL | 0 refills | Status: AC

## 2022-12-09 NOTE — Telephone Encounter (Signed)
Pt called stating whenever she lays down, she is experiencing nausea but it'll go away when she sits back up. Pt is asking for advice, pt stated if need by, she'll attend an appt with [facility]. Pt states she is currently taking the meds prescribed at last visit on 2/7, potassium chloride SA (KLOR-CON M) 20 MEQ tablet (Expired) & Vitamin D, Ergocalciferol, (DRISDOL) 1.25 MG (50000 UNIT) CAPS capsule. Call back # [phone]

## 2022-12-09 NOTE — Telephone Encounter (Signed)
Patient scheduled.

## 2022-12-09 NOTE — Telephone Encounter (Signed)
The potassium was for one dose. I assume she has completed that. The vitamin D is once a week. But yes please schedule her for evaluation

## 2022-12-09 NOTE — Telephone Encounter (Signed)
Gastroenterology Pre-Procedure Review  Request Date: 12/19/2022 Requesting Physician: Dr. Marius Ditch  PATIENT REVIEW QUESTIONS: The patient responded to the following health history questions as indicated:    1. Are you having any GI issues? no 2. Do you have a personal history of Polyps? no 3. Do you have a family history of Colon Cancer or Polyps? no 4. Diabetes Mellitus? no 5. Joint replacements in the past 12 months?no 6. Major health problems in the past 3 months?no 7. Any artificial heart valves, MVP, or defibrillator?no    MEDICATIONS & ALLERGIES:    Patient reports the following regarding taking any anticoagulation/antiplatelet therapy:   Plavix, Coumadin, Eliquis, Xarelto, Lovenox, Pradaxa, Brilinta, or Effient? no Aspirin? no  Patient confirms/reports the following medications:  Current Outpatient Medications  Medication Sig Dispense Refill   Vitamin D, Ergocalciferol, (DRISDOL) 1.25 MG (50000 UNIT) CAPS capsule Take 1 capsule (50,000 Units total) by mouth every 7 (seven) days. 12 capsule 0   Multiple Vitamin (MULTIVITAMIN) capsule Take 1 capsule by mouth daily.     oxyCODONE-acetaminophen (PERCOCET/ROXICET) 5-325 MG per tablet Take 1-2 tablets by mouth every 4 (four) hours as needed for moderate pain. (Patient not taking: Reported on 09/03/2022) 30 tablet 0   potassium chloride SA (KLOR-CON M) 20 MEQ tablet Take 2 tablets (40 mEq total) by mouth once for 1 dose. 2 tablet 0   No current facility-administered medications for this visit.    Patient confirms/reports the following allergies:  No Known Allergies  No orders of the defined types were placed in this encounter.   AUTHORIZATION INFORMATION Primary Insurance: 1D#: Group #:  Secondary Insurance: 1D#: Group #:  SCHEDULE INFORMATION: Date: 12/19/2022 Time: Location: [place]

## 2022-12-09 NOTE — Telephone Encounter (Signed)
Left message to return call to our office.  Pt needs appointment to see [facility].

## 2022-12-10 ENCOUNTER — Ambulatory Visit: Payer: BC Managed Care – PPO | Admitting: Nurse Practitioner

## 2022-12-10 ENCOUNTER — Encounter: Payer: Self-pay | Admitting: Nurse Practitioner

## 2022-12-10 VITALS — BP 136/82 | HR 104 | Temp 97.5°F | Resp 16 | Ht 60.0 in | Wt 141.5 lb

## 2022-12-10 DIAGNOSIS — R1013 Epigastric pain: Secondary | ICD-10-CM

## 2022-12-10 DIAGNOSIS — R110 Nausea: Secondary | ICD-10-CM

## 2022-12-10 MED ORDER — OMEPRAZOLE 20 MG PO CPDR: 20.0000 mg | DELAYED_RELEASE_CAPSULE | Freq: Every day | ORAL | 0 refills | Status: DC

## 2022-12-10 MED ORDER — ONDANSETRON 4 MG PO TBDP: 4.0000 mg | ORAL_TABLET | Freq: Three times a day (TID) | ORAL | 0 refills | Status: DC | PRN

## 2022-12-10 NOTE — Assessment & Plan Note (Signed)
Likely secondary to gastritis/overproduction of acid. Zofran 78m TID PRN nausea sent in

## 2022-12-10 NOTE — Assessment & Plan Note (Signed)
Start on omeprazole 65m QD for a month. Follow up if not making improvement in 7-10 days

## 2022-12-10 NOTE — Patient Instructions (Signed)
Nice to see you today I have sent in 2 medications to the pharmacy Try to eat today Let me know if you do not improve over the next 7 days

## 2022-12-10 NOTE — Progress Notes (Signed)
Acute Office Visit  Subjective:     Patient ID: Jasmine Lester, female    DOB: [birth year], 62 y.o.   MRN: [id number]  Chief Complaint  Patient presents with   Nausea    When she lays down "feels like she is going to throw up" not all the time. Started Sunday    HPI Patient is in today for nausea with a history of cholecystectomy, HDL, prediabetes, overweight.  Symptoms started on Sunday as the day went felt tired. States that she sat around and watched tv.  States that she did not go to work Monday. States that she walked from her car to the office she was really tired States Sunday night she laid down and and felt like she would throw up but sitting up would stop it. States that she has not ate anything since satuday. States that her granddaughter started feeling weird on Sunday but she has not been around her grand daughter   Review of Systems  Constitutional:  Positive for malaise/fatigue. Negative for chills and fever.  Gastrointestinal:  Positive for diarrhea (base line) and nausea. Negative for abdominal pain and vomiting.  Neurological:  Negative for dizziness and headaches.        Objective:    BP 136/82   Pulse (!) 104   Temp (!) 97.5 F (36.4 C)   Resp 16   Ht 5' (1.524 m)   Wt 141 lb 8 oz (64.2 kg)   SpO2 99%   BMI 27.63 kg/m    Physical Exam Vitals and nursing note reviewed.  Constitutional:      Appearance: Normal appearance.  HENT:     Right Ear: Tympanic membrane, ear canal and external ear normal.     Left Ear: Tympanic membrane, ear canal and external ear normal.     Mouth/Throat:     Mouth: Mucous membranes are moist.     Pharynx: Oropharynx is clear.  Cardiovascular:     Rate and Rhythm: Normal rate and regular rhythm.     Heart sounds: Normal heart sounds.  Pulmonary:     Effort: Pulmonary effort is normal.     Breath sounds: Normal breath sounds.  Abdominal:     General: Bowel sounds are normal. There is no distension.      Palpations: There is no mass.     Tenderness: There is abdominal tenderness.     Hernia: No hernia is present.    Lymphadenopathy:     Cervical: No cervical adenopathy.  Neurological:     Mental Status: She is alert.     No results found for any visits on 12/10/22.      Assessment & Plan:   Problem List Items Addressed This Visit       Other   Epigastric pain    Start on omeprazole 20m QD for a month. Follow up if not making improvement in 7-10 days       Relevant Medications   omeprazole (PRILOSEC) 20 MG capsule   Nausea - Primary    Likely secondary to gastritis/overproduction of acid. Zofran 464mTID PRN nausea sent in       Relevant Medications   ondansetron (ZOFRAN-ODT) 4 MG disintegrating tablet    Meds ordered this encounter  Medications   omeprazole (PRILOSEC) 20 MG capsule    Sig: Take 1 capsule (20 mg total) by mouth daily.    Dispense:  30 capsule    Refill:  0    Order Specific Question:  Supervising Provider    Answer:   Loura Pardon A [[id number]]   ondansetron (ZOFRAN-ODT) 4 MG disintegrating tablet    Sig: Take 1 tablet (4 mg total) by mouth every 8 (eight) hours as needed for nausea or vomiting.    Dispense:  20 tablet    Refill:  0    Order Specific Question:   Supervising Provider    Answer:   Loura Pardon A [[id number]]    Return in about 1 year (around 12/11/2023) for CPE and Labs.  Romilda Garret, NP

## 2022-12-12 ENCOUNTER — Telehealth: Payer: Self-pay | Admitting: Nurse Practitioner

## 2022-12-12 ENCOUNTER — Encounter: Payer: Self-pay | Admitting: Nurse Practitioner

## 2022-12-12 NOTE — Telephone Encounter (Signed)
We can provide a return to work note with no restrictions

## 2022-12-12 NOTE — Telephone Encounter (Signed)
Patient called back to discuss work note. States her supervisor said based on the note she could have come back to work today  Patient wants Catalina Antigua to write a note that says she was nauseous and that is why she was not at work.

## 2022-12-12 NOTE — Telephone Encounter (Signed)
Spoke to pt and she said she would like to talk to you.

## 2022-12-12 NOTE — Telephone Encounter (Signed)
Patient states letter was sent today, but states she can go back to work on Monday 2.26.24, not today. Please update letter and follow-up with the patient   Patient wants to know if omeprazole (PRILOSEC) 20 MG capsule  if this medicine makes you sick

## 2022-12-12 NOTE — Telephone Encounter (Signed)
The omeprazole she can take before a meal. She can take it in the morning or at night whichever is easier for her.   I am not changing the note

## 2022-12-12 NOTE — Telephone Encounter (Signed)
Patient would like to know if she should take the medication after she eats ,or while she is eating? She also asked about an updated note stating that she will return back to work om 12/15/2022.

## 2022-12-12 NOTE — Telephone Encounter (Signed)
Patient is ok to go back to work. The omprazole is not suppose to make you sick. I did prescribed some medication in case she gets nauseated

## 2022-12-12 NOTE — Telephone Encounter (Signed)
Pt called in requesting a returned to work note from PCP . Please advise # [phone]

## 2022-12-15 NOTE — Telephone Encounter (Signed)
If the patient stayed out of work then she stayed out of work. When I saw her in office she could have gone back to work the next day based on my assessment

## 2022-12-16 NOTE — Telephone Encounter (Signed)
Called patient reviewed message. Patient aware that note will not be changed. She had no further questions at this time.

## 2022-12-19 ENCOUNTER — Ambulatory Visit: Payer: BC Managed Care – PPO | Admitting: Anesthesiology

## 2022-12-19 ENCOUNTER — Encounter: Payer: Self-pay | Admitting: Gastroenterology

## 2022-12-19 ENCOUNTER — Encounter
Admission: RE | Disposition: A | Discharge status: Home / Self Care | Payer: Self-pay | Source: Home / Self Care | Attending: Gastroenterology

## 2022-12-19 ENCOUNTER — Ambulatory Visit
Admission: RE | Admit: 2022-12-19 | Discharge: 2022-12-19 | Disposition: A | Discharge status: Home / Self Care | Payer: BC Managed Care – PPO | Attending: Gastroenterology | Admitting: Gastroenterology

## 2022-12-19 DIAGNOSIS — K635 Polyp of colon: Secondary | ICD-10-CM

## 2022-12-19 DIAGNOSIS — D125 Benign neoplasm of sigmoid colon: Secondary | ICD-10-CM | POA: Insufficient documentation

## 2022-12-19 DIAGNOSIS — Z9049 Acquired absence of other specified parts of digestive tract: Secondary | ICD-10-CM | POA: Diagnosis not present

## 2022-12-19 DIAGNOSIS — K219 Gastro-esophageal reflux disease without esophagitis: Secondary | ICD-10-CM | POA: Diagnosis not present

## 2022-12-19 DIAGNOSIS — Z1211 Encounter for screening for malignant neoplasm of colon: Secondary | ICD-10-CM

## 2022-12-19 HISTORY — PX: COLONOSCOPY WITH PROPOFOL: SHX5780

## 2022-12-19 SURGERY — COLONOSCOPY WITH PROPOFOL
Anesthesia: General

## 2022-12-19 MED ORDER — PROPOFOL 10 MG/ML IV BOLUS
INTRAVENOUS | Status: AC
  Filled 2022-12-19: qty 40

## 2022-12-19 MED ORDER — PROPOFOL 10 MG/ML IV BOLUS
INTRAVENOUS | Status: DC | PRN
  Administered 2022-12-19: 90 mg via INTRAVENOUS
  Administered 2022-12-19: 140 ug/kg/min via INTRAVENOUS

## 2022-12-19 MED ORDER — SODIUM CHLORIDE 0.9 % IV SOLN: INTRAVENOUS | Status: DC

## 2022-12-19 MED ORDER — LIDOCAINE HCL (CARDIAC) PF 100 MG/5ML IV SOSY
PREFILLED_SYRINGE | INTRAVENOUS | Status: DC | PRN
  Administered 2022-12-19: 100 mg via INTRAVENOUS

## 2022-12-19 MED ORDER — LIDOCAINE HCL (PF) 2 % IJ SOLN
INTRAMUSCULAR | Status: AC
  Filled 2022-12-19: qty 5

## 2022-12-19 NOTE — Anesthesia Preprocedure Evaluation (Addendum)
Anesthesia Evaluation  Patient identified by MRN, date of birth, ID band Patient awake    Reviewed: Allergy & Precautions, H&P , NPO status , Patient's Chart, lab work & pertinent test results  Airway Mallampati: II  TM Distance: >3 FB Neck ROM: full    Dental  (+) Edentulous Upper, Missing   Pulmonary neg pulmonary ROS   Pulmonary exam normal        Cardiovascular negative cardio ROS Normal cardiovascular exam     Neuro/Psych Seizures -, Well Controlled,   negative psych ROS   GI/Hepatic Neg liver ROS,GERD  Controlled,,  Endo/Other  negative endocrine ROS    Renal/GU negative Renal ROS  negative genitourinary   Musculoskeletal   Abdominal Normal abdominal exam  (+)   Peds  Hematology negative hematology ROS (+)   Anesthesia Other Findings Past Medical History: No date: Heart murmur No date: Seizures [facility])  Past Surgical History: No date: CESAREAN SECTION     Comment:  1983 09/16/2014: CHOLECYSTECTOMY; N/A     Comment:  Procedure: LAPAROSCOPIC CHOLECYSTECTOMY WITHOUT               INTRAOPERATIVE CHOLANGIOGRAM;  Surgeon: Doreen Salvage, MD;                Location: [place];  Service: General;  Laterality: N/A;     Reproductive/Obstetrics negative OB ROS                             Anesthesia Physical Anesthesia Plan  ASA: 2  Anesthesia Plan: General   Post-op Pain Management:    Induction: Intravenous  PONV Risk Score and Plan: Propofol infusion and TIVA  Airway Management Planned: Natural Airway and Nasal Cannula  Additional Equipment:   Intra-op Plan:   Post-operative Plan:   Informed Consent: I have reviewed the patients History and Physical, chart, labs and discussed the procedure including the risks, benefits and alternatives for the proposed anesthesia with the patient or authorized representative who has indicated his/her understanding and acceptance.     Dental  Advisory Given  Plan Discussed with: Anesthesiologist, CRNA and Surgeon  Anesthesia Plan Comments:         Anesthesia Quick Evaluation

## 2022-12-19 NOTE — Op Note (Signed)
[facility] Gastroenterology Patient Name: Jasmine Lester Procedure Date: 12/19/2022 8:24 AM MRN: [id number] Account #: [id number] Date of Birth: 1961-02-18 Admit Type: Outpatient Age: 62 Room: [facility] ENDO ROOM 2 Gender: Female Note Status: Finalized Instrument Name: Park Meo [id number] Procedure:             Colonoscopy Indications:           Screening for colorectal malignant neoplasm, This is                         the patient's first colonoscopy Providers:             Lin Landsman MD, MD Referring MD:          Lin Landsman MD, MD (Referring MD), Alyson Locket.                         Charmian Muff (Referring MD) Medicines:             General Anesthesia Complications:         No immediate complications. Estimated blood loss: None. Procedure:             Pre-Anesthesia Assessment:                        - Prior to the procedure, a History and Physical was                         performed, and patient medications and allergies were                         reviewed. The patient is competent. The risks and                         benefits of the procedure and the sedation options and                         risks were discussed with the patient. All questions                         were answered and informed consent was obtained.                         Patient identification and proposed procedure were                         verified by the physician, the nurse, the                         anesthesiologist, the anesthetist and the technician                         in the pre-procedure area in the procedure room in the                         endoscopy suite. Mental Status Examination: alert and                         oriented. Airway Examination: normal oropharyngeal  airway and neck mobility. Respiratory Examination:                         clear to auscultation. CV Examination: normal.                         Prophylactic Antibiotics:  The patient does not require                         prophylactic antibiotics. Prior Anticoagulants: The                         patient has taken no anticoagulant or antiplatelet                         agents. ASA Grade Assessment: II - A patient with mild                         systemic disease. After reviewing the risks and                         benefits, the patient was deemed in satisfactory                         condition to undergo the procedure. The anesthesia                         plan was to use general anesthesia. Immediately prior                         to administration of medications, the patient was                         re-assessed for adequacy to receive sedatives. The                         heart rate, respiratory rate, oxygen saturations,                         blood pressure, adequacy of pulmonary ventilation, and                         response to care were monitored throughout the                         procedure. The physical status of the patient was                         re-assessed after the procedure.                        After obtaining informed consent, the colonoscope was                         passed under direct vision. Throughout the procedure,                         the patient's blood pressure, pulse, and oxygen  saturations were monitored continuously. The                         Colonoscope was introduced through the anus and                         advanced to the the cecum, identified by appendiceal                         orifice and ileocecal valve. The colonoscopy was                         performed without difficulty. The patient tolerated                         the procedure well. The quality of the bowel                         preparation was evaluated using the BBPS [facility] Bowel                         Preparation Scale) with scores of: Right Colon = 3,                         Transverse Colon = 3  and Left Colon = 3 (entire mucosa                         seen well with no residual staining, small fragments                         of stool or opaque liquid). The total BBPS score                         equals 9. The ileocecal valve, appendiceal orifice,                         and rectum were photographed. Findings:      The perianal and digital rectal examinations were normal. Pertinent       negatives include normal sphincter tone and no palpable rectal lesions.      A 9 mm polyp was found in the sigmoid colon. The polyp was sessile. The       polyp was removed with a hot snare. Resection and retrieval were       complete. Estimated blood loss: none.      The retroflexed view of the distal rectum and anal verge was normal and       showed no anal or rectal abnormalities.      The exam was otherwise without abnormality. Impression:            - One 9 mm polyp in the sigmoid colon, removed with a                         hot snare. Resected and retrieved.                        - The distal rectum and anal verge are normal on  retroflexion view.                        - The examination was otherwise normal. Recommendation:        - Discharge patient to home (with escort).                        - Resume previous diet today.                        - Continue present medications.                        - Await pathology results.                        - Repeat colonoscopy in 5 years for surveillance based                         on pathology results. Procedure Code(s):     --- Professional ---                        [phone], Colonoscopy, flexible; with removal of                         tumor(s), polyp(s), or other lesion(s) by snare                         technique Diagnosis Code(s):     --- Professional ---                        D12.5, Benign neoplasm of sigmoid colon                        Z12.11, Encounter for screening for malignant neoplasm                          of colon CPT copyright 2022 American Medical Association. All rights reserved. The codes documented in this report are preliminary and upon coder review may  be revised to meet current compliance requirements. Dr. Ulyess Mort Lin Landsman MD, MD 12/19/2022 8:49:09 AM This report has been signed electronically. Number of Addenda: 0 Note Initiated On: 12/19/2022 8:24 AM Scope Withdrawal Time: 0 hours 11 minutes 55 seconds  Total Procedure Duration: 0 hours 16 minutes 19 seconds  Estimated Blood Loss:  Estimated blood loss: none.      [facility]

## 2022-12-19 NOTE — Transfer of Care (Signed)
Immediate Anesthesia Transfer of Care Note  Patient: Jasmine Lester  Procedure(s) Performed: COLONOSCOPY WITH PROPOFOL  Patient Location: PACU  Anesthesia Type:General  Level of Consciousness: drowsy  Airway & Oxygen Therapy: Patient Spontanous Breathing  Post-op Assessment: Report given to RN and Post -op Vital signs reviewed and stable  Post vital signs: Reviewed and stable  Last Vitals:  Vitals Value Taken Time  BP 109/68 12/19/22 0851  Temp 36.4 C 12/19/22 0851  Pulse 78 12/19/22 0852  Resp 26 12/19/22 0852  SpO2 100 % 12/19/22 0852  Vitals shown include unvalidated device data.  Last Pain:  Vitals:   12/19/22 0851  TempSrc: Temporal  PainSc: 0-No pain         Complications: No notable events documented.

## 2022-12-19 NOTE — H&P (Signed)
Cephas Darby, MD [address]  [place]  [place], [place] [address]  Main: [phone]  Fax: [phone] Pager: [phone]  Primary Care Physician:  Michela Pitcher, NP Primary Gastroenterologist:  Dr. Cephas Darby  Pre-Procedure History & Physical: HPI:  Jasmine Lester is a 62 y.o. female is here for an colonoscopy.   Past Medical History:  Diagnosis Date   Heart murmur    Seizures ([place])     Past Surgical History:  Procedure Laterality Date   CESAREAN SECTION     1983   CHOLECYSTECTOMY N/A 09/16/2014   Procedure: LAPAROSCOPIC CHOLECYSTECTOMY WITHOUT INTRAOPERATIVE CHOLANGIOGRAM;  Surgeon: Doreen Salvage, MD;  Location: [place];  Service: General;  Laterality: N/A;    Prior to Admission medications   Medication Sig Start Date End Date Taking? Authorizing Provider  Multiple Vitamin (MULTIVITAMIN) capsule Take 1 capsule by mouth daily.   Yes [provider]  omeprazole (PRILOSEC) 20 MG capsule Take 1 capsule (20 mg total) by mouth daily. 12/10/22   Michela Pitcher, NP  ondansetron (ZOFRAN-ODT) 4 MG disintegrating tablet Take 1 tablet (4 mg total) by mouth every 8 (eight) hours as needed for nausea or vomiting. Patient not taking: Reported on 12/19/2022 12/10/22   Michela Pitcher, NP  oxyCODONE-acetaminophen (PERCOCET/ROXICET) 5-325 MG per tablet Take 1-2 tablets by mouth every 4 (four) hours as needed for moderate pain. Patient not taking: Reported on 09/03/2022 09/17/14   Johnathan Hausen, MD  potassium chloride SA (KLOR-CON M) 20 MEQ tablet Take 2 tablets (40 mEq total) by mouth once for 1 dose. 11/28/22 11/28/22  Michela Pitcher, NP  Vitamin D, Ergocalciferol, (DRISDOL) 1.25 MG (50000 UNIT) CAPS capsule Take 1 capsule (50,000 Units total) by mouth every 7 (seven) days. Patient not taking: Reported on 12/19/2022 11/27/22   Michela Pitcher, NP    Allergies as of 12/09/2022   (No Known Allergies)    Family History  Problem Relation Age of Onset   Hypertension Mother     Hypertension Maternal Grandmother    Hypertension Maternal Grandfather    Hypertension Paternal Grandmother    Hypertension Paternal Grandfather     Social History   Socioeconomic History   Marital status: Single    Spouse name: Not on file   Number of children: 1   Years of education: college   Highest education level: Not on file  Occupational History   Not on file  Tobacco Use   Smoking status: Never   Smokeless tobacco: Never  Vaping Use   Vaping Use: Never used  Substance and Sexual Activity   Alcohol use: Yes    Comment: occ   Drug use: Never   Sexual activity: Not on file  Other Topics Concern   Not on file  Social History Narrative   [employer] 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      Dontate (24)   2 grandkids and 1 step       Hobbies: reading and movies    Social Determinants of Radio broadcast assistant Strain: Not on file  Food Insecurity: Not on file  Transportation Needs: Not on file  Physical Activity: Not on file  Stress: Not on file  Social Connections: Not on file  Intimate Partner Violence: Not on file    Review of Systems: See HPI, otherwise negative ROS  Physical Exam: BP (!) 160/109   Pulse 100   Temp (!) 97.4 F (36.3 C) (Temporal)   Resp 18   Ht '5\' 1"'$  (  1.549 m)   Wt 62.6 kg   SpO2 100%   BMI 26.07 kg/m  General:   Alert,  pleasant and cooperative in NAD Head:  Normocephalic and atraumatic. Neck:  Supple; no masses or thyromegaly. Lungs:  Clear throughout to auscultation.    Heart:  Regular rate and rhythm. Abdomen:  Soft, nontender and nondistended. Normal bowel sounds, without guarding, and without rebound.   Neurologic:  Alert and  oriented x4;  grossly normal neurologically.  Impression/Plan: Jasmine Lester is here for an colonoscopy to be performed for colon cancer screening  Risks, benefits, limitations, and alternatives regarding  colonoscopy have been reviewed with the patient.  Questions have been answered.  All parties  agreeable.   Sherri Sear, MD  12/19/2022, 7:50 AM

## 2022-12-19 NOTE — Anesthesia Postprocedure Evaluation (Signed)
Anesthesia Post Note  Patient: Jasmine Lester  Procedure(s) Performed: COLONOSCOPY WITH PROPOFOL  Patient location during evaluation: PACU Anesthesia Type: General Level of consciousness: awake and alert Pain management: pain level controlled Vital Signs Assessment: post-procedure vital signs reviewed and stable Respiratory status: spontaneous breathing, nonlabored ventilation and respiratory function stable Cardiovascular status: blood pressure returned to baseline and stable Postop Assessment: no apparent nausea or vomiting Anesthetic complications: no   No notable events documented.   Last Vitals:  Vitals:   12/19/22 0901 12/19/22 0911  BP: 115/82 (!) 133/96  Pulse:    Resp:    Temp:    SpO2:      Last Pain:  Vitals:   12/19/22 0911  TempSrc:   PainSc: 0-No pain                 [place] Ouch

## 2022-12-22 ENCOUNTER — Encounter: Payer: Self-pay | Admitting: Gastroenterology

## 2022-12-22 LAB — SURGICAL PATHOLOGY

## 2022-12-23 ENCOUNTER — Encounter: Payer: Self-pay | Admitting: Gastroenterology

## 2023-01-06 ENCOUNTER — Other Ambulatory Visit: Payer: Self-pay | Admitting: Nurse Practitioner

## 2023-01-06 DIAGNOSIS — R1013 Epigastric pain: Secondary | ICD-10-CM

## 2023-01-07 NOTE — Telephone Encounter (Signed)
Called pt and she said that she has been doing good and has not had any symptoms like when she seen you . She has 7 days that she is going to finish and then she will stop the medication. Will call back and let us know how she does off of them.

## 2023-01-15 ENCOUNTER — Other Ambulatory Visit: Payer: Self-pay | Admitting: Nurse Practitioner

## 2023-01-15 DIAGNOSIS — R1013 Epigastric pain: Secondary | ICD-10-CM

## 2023-01-15 MED ORDER — OMEPRAZOLE 20 MG PO CPDR: 20.0000 mg | DELAYED_RELEASE_CAPSULE | Freq: Every day | ORAL | 0 refills | Status: DC

## 2023-01-15 NOTE — Telephone Encounter (Signed)
Prescription Request  Prescription Request  Prescription Request  01/15/2023  LOV: 12/10/2022  What is the name of the medication or equipment?  omeprazole (PRILOSEC) 20 MG capsule  Have you contacted your pharmacy to request a refill? No   Which pharmacy would you like this sent to?   [id number] [address], [place] [address] [place] [address] [place] [address] Phone: [phone] Fax: [phone]    Patient notified that their request is being sent to the clinical staff for review and that they should receive a response within 2 business days.   Please advise at Mobile [phone] (mobile)

## 2023-02-21 ENCOUNTER — Other Ambulatory Visit: Payer: Self-pay | Admitting: Nurse Practitioner

## 2023-02-21 DIAGNOSIS — R1013 Epigastric pain: Secondary | ICD-10-CM

## 2023-02-23 NOTE — Telephone Encounter (Signed)
Can we see if the medication helped and if she wants to continue for another 2 months if needed

## 2023-02-24 NOTE — Telephone Encounter (Signed)
Called pt and she states that it help her. She get nausea in the morning, but then she take the medication and it helps.

## 2023-07-21 DIAGNOSIS — Z419 Encounter for procedure for purposes other than remedying health state, unspecified: Secondary | ICD-10-CM | POA: Diagnosis not present

## 2023-08-21 DIAGNOSIS — Z419 Encounter for procedure for purposes other than remedying health state, unspecified: Secondary | ICD-10-CM | POA: Diagnosis not present

## 2023-09-20 DIAGNOSIS — Z419 Encounter for procedure for purposes other than remedying health state, unspecified: Secondary | ICD-10-CM | POA: Diagnosis not present

## 2023-10-21 DIAGNOSIS — Z419 Encounter for procedure for purposes other than remedying health state, unspecified: Secondary | ICD-10-CM | POA: Diagnosis not present

## 2023-10-23 ENCOUNTER — Inpatient Hospital Stay: Admission: RE | Admit: 2023-10-23 | Payer: BC Managed Care – PPO | Source: Ambulatory Visit

## 2023-11-18 NOTE — Telephone Encounter (Signed)
Copied from CRM [id number]. Topic: General - Other >> Nov 18, 2023 12:37 PM Elizebeth Brooking wrote: Reason for CRM: Patient called in regarding a missed call, informed patient I did not see any missed call but if someone called they will give her a call back

## 2023-11-21 DIAGNOSIS — Z419 Encounter for procedure for purposes other than remedying health state, unspecified: Secondary | ICD-10-CM | POA: Diagnosis not present

## 2023-11-27 ENCOUNTER — Other Ambulatory Visit: Payer: Self-pay

## 2023-12-19 DIAGNOSIS — Z419 Encounter for procedure for purposes other than remedying health state, unspecified: Secondary | ICD-10-CM | POA: Diagnosis not present

## 2023-12-23 ENCOUNTER — Other Ambulatory Visit: Payer: Self-pay | Admitting: Nurse Practitioner

## 2023-12-23 DIAGNOSIS — Z1231 Encounter for screening mammogram for malignant neoplasm of breast: Secondary | ICD-10-CM

## 2023-12-25 ENCOUNTER — Inpatient Hospital Stay: Admission: RE | Admit: 2023-12-25 | Payer: BC Managed Care – PPO | Source: Ambulatory Visit

## 2023-12-25 ENCOUNTER — Encounter: Payer: Medicaid Other | Admitting: Nurse Practitioner

## 2023-12-29 ENCOUNTER — Encounter: Payer: Self-pay | Admitting: Nurse Practitioner

## 2024-01-30 DIAGNOSIS — Z419 Encounter for procedure for purposes other than remedying health state, unspecified: Secondary | ICD-10-CM | POA: Diagnosis not present

## 2024-02-29 DIAGNOSIS — Z419 Encounter for procedure for purposes other than remedying health state, unspecified: Secondary | ICD-10-CM | POA: Diagnosis not present

## 2024-03-31 DIAGNOSIS — Z419 Encounter for procedure for purposes other than remedying health state, unspecified: Secondary | ICD-10-CM | POA: Diagnosis not present

## 2024-04-30 DIAGNOSIS — Z419 Encounter for procedure for purposes other than remedying health state, unspecified: Secondary | ICD-10-CM | POA: Diagnosis not present

## 2024-05-29 ENCOUNTER — Other Ambulatory Visit: Payer: Self-pay

## 2024-05-29 ENCOUNTER — Emergency Department (HOSPITAL_COMMUNITY)

## 2024-05-29 ENCOUNTER — Emergency Department (HOSPITAL_COMMUNITY)
Admission: EM | Admit: 2024-05-29 | Discharge: 2024-05-29 | Disposition: A | Discharge status: Home / Self Care | Attending: Emergency Medicine | Admitting: Emergency Medicine

## 2024-05-29 ENCOUNTER — Encounter (HOSPITAL_COMMUNITY): Payer: Self-pay

## 2024-05-29 DIAGNOSIS — Z743 Need for continuous supervision: Secondary | ICD-10-CM | POA: Diagnosis not present

## 2024-05-29 DIAGNOSIS — I63233 Cerebral infarction due to unspecified occlusion or stenosis of bilateral carotid arteries: Secondary | ICD-10-CM | POA: Diagnosis not present

## 2024-05-29 DIAGNOSIS — G40409 Other generalized epilepsy and epileptic syndromes, not intractable, without status epilepticus: Secondary | ICD-10-CM | POA: Diagnosis not present

## 2024-05-29 DIAGNOSIS — I517 Cardiomegaly: Secondary | ICD-10-CM | POA: Diagnosis not present

## 2024-05-29 DIAGNOSIS — R0989 Other specified symptoms and signs involving the circulatory and respiratory systems: Secondary | ICD-10-CM | POA: Diagnosis not present

## 2024-05-29 DIAGNOSIS — R569 Unspecified convulsions: Secondary | ICD-10-CM | POA: Insufficient documentation

## 2024-05-29 DIAGNOSIS — R9431 Abnormal electrocardiogram [ECG] [EKG]: Secondary | ICD-10-CM | POA: Diagnosis not present

## 2024-05-29 DIAGNOSIS — R000 Tachycardia, unspecified: Secondary | ICD-10-CM | POA: Diagnosis not present

## 2024-05-29 LAB — COMPREHENSIVE METABOLIC PANEL WITH GFR
ALT: 14 U/L (ref 0–44)
AST: 18 U/L (ref 15–41)
Albumin: 2.8 g/dL — ABNORMAL LOW (ref 3.5–5.0)
Alkaline Phosphatase: 65 U/L (ref 38–126)
Anion gap: 10 (ref 5–15)
BUN: 9 mg/dL (ref 8–23)
CO2: 20 mmol/L — ABNORMAL LOW (ref 22–32)
Calcium: 7.6 mg/dL — ABNORMAL LOW (ref 8.9–10.3)
Chloride: 113 mmol/L — ABNORMAL HIGH (ref 98–111)
Creatinine, Ser: 0.63 mg/dL (ref 0.44–1.00)
GFR, Estimated: >60 mL/min (ref >60)
Glucose, Bld: 98 mg/dL (ref 70–99)
Potassium: 3.4 mmol/L — ABNORMAL LOW (ref 3.5–5.1)
Sodium: 143 mmol/L (ref 135–145)
Total Bilirubin: 0.6 mg/dL (ref 0.0–1.2)
Total Protein: 5.5 g/dL — ABNORMAL LOW (ref 6.5–8.1)

## 2024-05-29 LAB — CBC
HCT: 34.9 % — ABNORMAL LOW (ref 36.0–46.0)
Hemoglobin: 11.3 g/dL — ABNORMAL LOW (ref 12.0–15.0)
MCH: 27.1 pg (ref 26.0–34.0)
MCHC: 32.4 g/dL (ref 30.0–36.0)
MCV: 83.7 fL (ref 80.0–100.0)
Platelets: 199 K/uL (ref 150–400)
RBC: 4.17 MIL/uL (ref 3.87–5.11)
RDW: 15 % (ref 11.5–15.5)
WBC: 5.2 K/uL (ref 4.0–10.5)
nRBC: 0 % (ref 0.0–0.2)

## 2024-05-29 LAB — URINALYSIS, ROUTINE W REFLEX MICROSCOPIC
Bilirubin Urine: NEGATIVE
Glucose, UA: NEGATIVE mg/dL
Hgb urine dipstick: NEGATIVE
Ketones, ur: NEGATIVE mg/dL
Nitrite: NEGATIVE
Protein, ur: 30 mg/dL — AB
Specific Gravity, Urine: 1.013 (ref 1.005–1.030)
pH: 6 (ref 5.0–8.0)

## 2024-05-29 MED ORDER — CALCIUM CARBONATE ANTACID 500 MG PO CHEW
800.0000 mg | CHEWABLE_TABLET | Freq: Once | ORAL | Status: AC
  Administered 2024-05-29: 800 mg via ORAL
  Filled 2024-05-29: qty 4

## 2024-05-29 MED ORDER — LEVETIRACETAM 500 MG PO TABS: 500.0000 mg | ORAL_TABLET | Freq: Two times a day (BID) | ORAL | 1 refills | Status: DC

## 2024-05-29 MED ORDER — LEVETIRACETAM (KEPPRA) 500 MG/5 ML ADULT IV PUSH
1000.0000 mg | Freq: Once | INTRAVENOUS | Status: AC
  Administered 2024-05-29: 1000 mg via INTRAVENOUS
  Filled 2024-05-29: qty 10

## 2024-05-29 MED ORDER — LEVETIRACETAM 500 MG PO TABS
1000.0000 mg | ORAL_TABLET | Freq: Once | ORAL | Status: DC
  Filled 2024-05-29: qty 2

## 2024-05-29 NOTE — Discharge Instructions (Signed)
 You were evaluated in the Emergency Department and after careful evaluation, we did not find any emergent condition requiring admission or further testing in the hospital.  Your exam/testing today was overall reassuring.  Symptoms may have been due to a seizure.  We discussed your case with the neurologist.  We recommend use of the antiseizure medication Keppra  twice daily as prescribed.  We also recommend increasing the calcium  in your diet.  Will be important that you follow-up with neurology.  Until you see neurology you should not drive a car or operate heavy machinery or swim by yourself in case you have another seizure.  Please return to the Emergency Department if you experience any worsening of your condition.  Thank you for allowing us  to be a part of your care.

## 2024-05-29 NOTE — ED Notes (Signed)
 Patient transported to CT

## 2024-05-29 NOTE — ED Notes (Signed)
 Patient was able to ambulate from room to restroom and back. Patient was independent with steady gait. This nurse ambulated along side patient. Patient denied pain or dizziness.

## 2024-05-29 NOTE — ED Notes (Signed)
 Patient returned from CT

## 2024-05-29 NOTE — ED Provider Notes (Signed)
 [facility]-EMERGENCY DEPT [facility] Emergency Department Provider Note MRN:  [id number]  Arrival date & time: 05/29/24     Chief Complaint   Seizures   History of Present Illness   Jasmine Lester is a 63 y.o. year-old female with a history of seizures presenting to the ED with chief complaint of seizures.  Patient found to be having abnormal movements and unresponsiveness this evening.  Patient's son heard a gurgling disturbance in patient's room and went to investigate.  Patient was unresponsive, eyes deviated upward, abnormal movements to the body.  Seemed concerning for seizure activity.  EMS was called.  EMS reports postictal behavior.  Patient feels well now, denies pain, no numbness or weakness to the arms or legs, no headache.  Family was advised to perform CPR but patient denies any chest pain or soreness.  Review of Systems  A thorough review of systems was obtained and all systems are negative except as noted in the HPI and PMH.   Patient's Health History    Past Medical History:  Diagnosis Date   Heart murmur    Seizures (HCC)     Past Surgical History:  Procedure Laterality Date   CESAREAN SECTION     1983   CHOLECYSTECTOMY N/A 09/16/2014   Procedure: LAPAROSCOPIC CHOLECYSTECTOMY WITHOUT INTRAOPERATIVE CHOLANGIOGRAM;  Surgeon: Gordy Pina, MD;  [facility];  Service: General;  Laterality: N/A;   COLONOSCOPY WITH PROPOFOL  N/A 12/19/2022   Procedure: COLONOSCOPY WITH PROPOFOL ;  Surgeon: Unk Corinn Skiff, MD;  Location: [facility] ENDOSCOPY;  Service: Gastroenterology;  Laterality: N/A;    Family History  Problem Relation Age of Onset   Hypertension Mother    Hypertension Maternal Grandmother    Hypertension Maternal Grandfather    Hypertension Paternal Grandmother    Hypertension Paternal Grandfather     Social History   Socioeconomic History   Marital status: Single    Spouse name: Not on file   Number of children: 1   Years of education: college   Highest  education level: Not on file  Occupational History   Not on file  Tobacco Use   Smoking status: Never   Smokeless tobacco: Never  Vaping Use   Vaping status: Never Used  Substance and Sexual Activity   Alcohol use: Yes    Comment: occ   Drug use: Never   Sexual activity: Not on file  Other Topics Concern   Not on file  Social History Narrative   [employer] 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      Dontate (41)   2 grandkids and 1 step       Hobbies: reading and movies    Social Drivers of Corporate investment banker Strain: Not on file  Food Insecurity: Not on file  Transportation Needs: Not on file  Physical Activity: Not on file  Stress: Not on file  Social Connections: Not on file  Intimate Partner Violence: Not on file     Physical Exam   Vitals:   05/29/24 0354 05/29/24 0357  BP:    Pulse: (!) 104   Resp:    Temp:  98.6 F (37 C)  SpO2: 100%     CONSTITUTIONAL: Well-appearing, NAD NEURO/PSYCH:  Alert and oriented x 3, no focal deficits EYES:  eyes equal and reactive ENT/NECK:  no LAD, no JVD CARDIO: Regular rate, well-perfused, normal S1 and S2 PULM:  CTAB no wheezing or rhonchi GI/GU:  non-distended, non-tender MSK/SPINE:  No gross deformities, no edema SKIN:  no rash, atraumatic   *  Additional and/or pertinent findings included in MDM below  Diagnostic and Interventional Summary    EKG Interpretation Date/Time:  "Sunday May 29 2024 04:25:39 EDT Ventricular Rate:  97 PR Interval:  165 QRS Duration:  79 QT Interval:  320 QTC Calculation: 407 R Axis:   55  Text Interpretation: Sinus rhythm Anterior infarct, old Confirmed by Chaselynn Kepple ([id number]) on 05/29/2024 5:37:21 AM       Labs Reviewed  CBC - Abnormal; Notable for the following components:      Result Value   Hemoglobin 11.3 (*)    HCT 34.9 (*)    All other components within normal limits  COMPREHENSIVE METABOLIC PANEL WITH GFR - Abnormal; Notable for the following components:   Potassium 3.4 (*)     Chloride 113 (*)    CO2 20 (*)    Calcium 7.6 (*)    Total Protein 5.5 (*)    Albumin 2.8 (*)    All other components within normal limits  URINALYSIS, ROUTINE W REFLEX MICROSCOPIC - Abnormal; Notable for the following components:   APPearance HAZY (*)    Protein, ur 30 (*)    Leukocytes,Ua TRACE (*)    Bacteria, UA RARE (*)    All other components within normal limits    CT HEAD WO CONTRAST (5MM)  Final Result    DG Chest Port 1 View  Final Result      Medications  levETIRAcetam (KEPPRA) tablet 1,000 mg (has no administration in time range)  calcium carbonate (TUMS - dosed in mg elemental calcium) chewable tablet 800 mg of elemental calcium (has no administration in time range)     Procedures  /  Critical Care Procedures  ED Course and Medical Decision Making  Initial Impression and Ddx Suspect seizure this evening given the history.  Patient has a history of a few seizures in the past.  Does not have any significant cardiac risk factors, no DVT, nothing to suggest PE or cardiopulmonary cause of patient's symptoms.  Asymptomatic currently.  Past medical/surgical history that increases complexity of ED encounter: History of seizures  Interpretation of Diagnostics I personally reviewed the EKG and my interpretation is as follows: Sinus rhythm without concerning features  No significant blood count or electrolyte disturbance.  CT head with no acute findings.  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  Patient Reassessment and Ultimate Disposition/Management     Continues to look and feel well.  Case discussed with neurology recommending long-term Keppra, outpatient follow-up.  Appropriate for discharge.  Patient management required discussion with the following services or consulting groups:  Neurology  Complexity of Problems Addressed Acute illness or injury that poses threat of life of bodily  function  Additional Data Reviewed and Analyzed Further history obtained from: Further history from spouse/family member  Additional Factors Impacting ED Encounter Risk Consideration of hospitalization  [place] M. Jamell Laymon, MD [facility] [email or web address]  Final Clinical Impressions(s) / ED Diagnoses     ICD-10-CM   1. Seizure (HCC)  R56.9 Ambulatory referral to Neurology      ED Discharge Orders          Ordered    levETIRAcetam (KEPPRA) 500 MG tablet  2 times daily        08" /10/25 0555    Ambulatory referral to Neurology       Comments: An appointment is requested in approximately: 1 week  05/29/24 0555             Discharge Instructions Discussed with and Provided to Patient:    Discharge Instructions      You were evaluated in the Emergency Department and after careful evaluation, we did not find any emergent condition requiring admission or further testing in the hospital.  Your exam/testing today was overall reassuring.  Symptoms may have been due to a seizure.  We discussed your case with the neurologist.  We recommend use of the antiseizure medication Keppra  twice daily as prescribed.  We also recommend increasing the calcium  in your diet.  Will be important that you follow-up with neurology.  Until you see neurology you should not drive a car or operate heavy machinery or swim by yourself in case you have another seizure.  Please return to the Emergency Department if you experience any worsening of your condition.  Thank you for allowing us  to be a part of your care.       Theadore Ozell HERO, MD 05/29/24 [phone]

## 2024-05-29 NOTE — ED Triage Notes (Signed)
 Patient's family stated to EMS that patient was having a tonic-clonic seizure. Patient was postictal on scene but has become more alert. Patient is currently A/Ox3, confused to the year. Hx of seizures, last one was 8 years ago, does not take medication for them. EMS VS 152/94 BP 113 HR 97% RA 164 CBG

## 2024-05-31 DIAGNOSIS — Z419 Encounter for procedure for purposes other than remedying health state, unspecified: Secondary | ICD-10-CM | POA: Diagnosis not present

## 2024-06-21 ENCOUNTER — Encounter: Payer: Self-pay | Admitting: Neurology

## 2024-06-21 ENCOUNTER — Ambulatory Visit: Admitting: Neurology

## 2024-06-21 VITALS — BP 138/82 | HR 92 | Ht 61.0 in | Wt 132.0 lb

## 2024-06-21 DIAGNOSIS — Z5181 Encounter for therapeutic drug level monitoring: Secondary | ICD-10-CM | POA: Diagnosis not present

## 2024-06-21 DIAGNOSIS — G40209 Localization-related (focal) (partial) symptomatic epilepsy and epileptic syndromes with complex partial seizures, not intractable, without status epilepticus: Secondary | ICD-10-CM | POA: Diagnosis not present

## 2024-06-21 MED ORDER — VALTOCO 20 MG DOSE 2 X 10 MG/0.1ML NA LQPK: 20.0000 mg | NASAL | 0 refills | Status: AC | PRN

## 2024-06-21 MED ORDER — LEVETIRACETAM 500 MG PO TABS: 500.0000 mg | ORAL_TABLET | Freq: Two times a day (BID) | ORAL | 4 refills | Status: AC

## 2024-06-21 NOTE — Progress Notes (Signed)
 [facility]  PATIENT: Jasmine Lester DOB: December 06, 1960  REQUESTING CLINICIAN: Theadore Ozell HERO, MD HISTORY FROM: Patient and son REASON FOR VISIT: Seizure    HISTORICAL  CHIEF COMPLAINT:  Chief Complaint  Patient presents with   New Patient (Initial Visit)    RM 13 Last seizure a month ago    HISTORY OF PRESENT ILLNESS:  This is a 63 year old woman with past medical history of seizures back in 2002 2003 who is presenting after a breakthrough seizure on May 29 2024.  Patient reports her first seizure back in 2002, reported was a nocturnal seizure, generalized convulsion.  She did follow-up with Dr. Maurice, was started on antiseizure medication but does not remember the name.  She tells me discontinued the medication after 2 years due to cost and fortunately she has not had an additional seizure until May 29 2024, when she was witnessed to have a generalized convulsion by her son.  Son reports that he woke up around 2 AM, heard her mother making loud noise, turned on the light and found her foaming at the mouth, right arm extended, eyes rolled back with generalized shaking.  Due to the deep breathing, chest compressions were done.  EMS was called patient.  Patient tells me that she remembers going to bed fine and the next thing that she remembers is waking up in the hospital.  She was discharged on Keppra  500 mg twice daily.  She denies any injuries from the seizure.  She reports compliance with the Keppra , denies any side effect.   Handedness: Right handed  Onset: 2002  Seizure Type: generalized convulsion, right arm extended, foaming at the mouth, gurgling nose   Current frequency: Last one was in 2003  Any injuries from seizures: Denies   Seizure risk factors: Denies  Previous ASMs: Does not remember name   Currenty ASMs: Levetiracetam  500 mg twice daily   ASMs side effects: Denies   Brain Images: 4 mm subcortical hyperdensity in the anterior right  frontal lobe and a similar periventricular hyperdensity in the posterior left caudate  Previous EEGs: Not available for review    OTHER MEDICAL CONDITIONS: Seizure disorder  REVIEW OF SYSTEMS: Full 14 system review of systems performed and negative with exception of: As noted in the HPI   ALLERGIES: No Known Allergies  HOME MEDICATIONS: Outpatient Medications Prior to Visit  Medication Sig Dispense Refill   Multiple Vitamin (MULTIVITAMIN) capsule Take 1 capsule by mouth daily.     levETIRAcetam  (KEPPRA ) 500 MG tablet Take 1 tablet (500 mg total) by mouth 2 (two) times daily. 60 tablet 1   omeprazole  (PRILOSEC) 20 MG capsule TAKE 1 CAPSULE(20 MG) BY MOUTH DAILY (Patient not taking: Reported on 06/21/2024) 30 capsule 2   potassium chloride  SA (KLOR-CON  M) 20 MEQ tablet Take 2 tablets (40 mEq total) by mouth once for 1 dose. (Patient not taking: Reported on 06/21/2024) 2 tablet 0   No facility-administered medications prior to visit.    PAST MEDICAL HISTORY: Past Medical History:  Diagnosis Date   Heart murmur    Seizures (HCC)     PAST SURGICAL HISTORY: Past Surgical History:  Procedure Laterality Date   CESAREAN SECTION     1983   CHOLECYSTECTOMY N/A 09/16/2014   Procedure: LAPAROSCOPIC CHOLECYSTECTOMY WITHOUT INTRAOPERATIVE CHOLANGIOGRAM;  Surgeon: Gordy Pina, MD;  Location: [facility] OR;  Service: General;  Laterality: N/A;   COLONOSCOPY WITH PROPOFOL  N/A 12/19/2022   Procedure: COLONOSCOPY WITH PROPOFOL ;  Surgeon: Unk Corinn Skiff,  MD;  Location: [facility] END[facility]COPY;  Service: Gastroenterology;  Laterality: N/A;    FAMILY HISTORY: Family History  Problem Relation Age of Onset   Hypertension Mother    Hypertension Maternal Grandmother    Hypertension Maternal Grandfather    Hypertension Paternal Grandmother    Hypertension Paternal Grandfather     SOCIAL HISTORY: Social History   Socioeconomic History   Marital status: Single    Spouse name: Not on file   Number of children:  1   Years of education: college   Highest education level: Not on file  Occupational History   Not on file  Tobacco Use   Smoking status: Never   Smokeless tobacco: Never  Vaping Use   Vaping status: Never Used  Substance and Sexual Activity   Alcohol use: Yes    Comment: occ   Drug use: Never   Sexual activity: Not on file  Other Topics Concern   Not on file  Social History Narrative   [employer] 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      Dontate (41)   2 grandkids and 1 step       Hobbies: reading and movies       Right handed, lives with son   Social Drivers of Corporate investment banker Strain: Not on file  Food Insecurity: Not on file  Transportation Needs: Not on file  Physical Activity: Not on file  Stress: Not on file  Social Connections: Not on file  Intimate Partner Violence: Not on file   PHYSICAL EXAM  GENERAL EXAM/CONSTITUTIONAL: Vitals:  Vitals:   06/21/24 0821  BP: 138/82  Pulse: 92  SpO2: 98%  Weight: 132 lb (59.9 kg)  Height: 5' 1 (1.549 m)   Body mass index is 24.94 kg/m. Wt Readings from Last 3 Encounters:  06/21/24 132 lb (59.9 kg)  12/19/22 138 lb (62.6 kg)  12/10/22 141 lb 8 oz (64.2 kg)   Patient is in no distress; well developed, nourished and groomed; neck is supple  MUSCULOSKELETAL: Gait, strength, tone, movements noted in Neurologic exam below  NEUROLOGIC: MENTAL STATUS:      No data to display         awake, alert, oriented to person, place and time recent and remote memory intact normal attention and concentration language fluent, comprehension intact, naming intact fund of knowledge appropriate  CRANIAL NERVE:  2nd, 3rd, 4th, 6th - Visual fields full to confrontation, extraocular muscles intact, no nystagmus 5th - facial sensation symmetric 7th - facial strength symmetric 8th - hearing intact 9th - palate elevates symmetrically, uvula midline 11th - shoulder shrug symmetric 12th - tongue protrusion midline  MOTOR:  normal  bulk and tone, full strength in the BUE, BLE  SENSORY:  normal and symmetric to light touch  COORDINATION:  finger-nose-finger, fine finger movements normal  GAIT/STATION:  normal   DIAGNOSTIC DATA (LABS, IMAGING, TESTING) - I reviewed patient records, labs, notes, testing and imaging myself where available.  Lab Results  Component Value Date   WBC 5.2 05/29/2024   HGB 11.3 (L) 05/29/2024   HCT 34.9 (L) 05/29/2024   MCV 83.7 05/29/2024   PLT 199 05/29/2024      Component Value Date/Time   NA 143 05/29/2024 0433   K 3.4 (L) 05/29/2024 0433   CL 113 (H) 05/29/2024 0433   CO2 20 (L) 05/29/2024 0433   GLUCOSE 98 05/29/2024 0433   BUN 9 05/29/2024 0433   CREATININE 0.63 05/29/2024 0433   CALCIUM  7.6 (L) 05/29/2024 9566  PROT 5.5 (L) 05/29/2024 0433   ALBUMIN 2.8 (L) 05/29/2024 0433   AST 18 05/29/2024 0433   ALT 14 05/29/2024 0433   ALKPHOS 65 05/29/2024 0433   BILITOT 0.6 05/29/2024 0433   GFRNONAA >60 05/29/2024 0433   GFRAA >90 09/15/2014 1926   Lab Results  Component Value Date   CHOL 247 (H) 11/26/2022   HDL 74.00 11/26/2022   LDLCALC 151 (H) 11/26/2022   TRIG 110.0 11/26/2022   Lab Results  Component Value Date   HGBA1C 6.0 11/26/2022   Lab Results  Component Value Date   VITAMINB12 264 11/26/2022   Lab Results  Component Value Date   TSH 0.92 11/26/2022    Head CT 05/29/2024 1. No acute intracranial abnormality related to the clinical history of seizure. 2. 4 mm subcortical hyperdensity in the anterior right frontal lobe and a similar periventricular hyperdensity in the posterior left caudate. 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 3. 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 4. 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   ASSESSMENT AND PLAN  63 y.o. year old female  with history  of seizure who is presenting after a breakthrough seizure on August 10.  Seizure described as generalized convulsion.  She has been started on Keppra  500 mg twice daily, reports compliance and denies any side effect from the medication.   Plan will be to obtain a Keppra  level, obtain a routine EEG and MRI brain.  I will also give her Valtoco  as rescue medication.  Advised her to contact me if she has any breakthrough seizure otherwise we will see her in 6 to 8 months for follow-up or sooner if worse.  We also discussed driving restriction for total of 6 months. patient voiced understanding   1. Partial symptomatic epilepsy with complex partial seizures, not intractable, without status epilepticus (HCC)   2. Therapeutic drug monitoring     Patient Instructions  Continue with Keppra  500 mg twice daily, refill given Will obtain routine EEG MRI brain with and without contrast Will give Valtoco  as rescue medication Please contact me if you do have a breakthrough seizure Follow-up in 6 to 8 months or sooner if worse Discussed driving restriction for total of 6 months, patient voiced understanding.   Per [place] statutes, patients with seizures are not allowed to drive until they have been seizure-free for six months.  Other recommendations include using caution when using heavy equipment or power tools. Avoid working on ladders or at heights. Take showers instead of baths.  Do not swim alone.  Ensure the water temperature is not too high on the home water heater. Do not go swimming alone. Do not lock yourself in a room alone (i.e. bathroom). When caring for infants or small children, sit down when holding, feeding, or changing them to minimize risk of injury to the child in the event you have a seizure. Maintain good sleep hygiene. Avoid alcohol.  Also recommend adequate sleep, hydration, good diet and minimize stress.   During the Seizure  - First, ensure adequate ventilation and place  patients on the floor on their left side  Loosen clothing around the neck and ensure the airway is patent. If the patient is clenching the teeth, do not force the mouth open with any object as this can cause severe damage - Remove all items from the surrounding that can be hazardous.  The patient may be oblivious to what's happening and may not even know what he or she is doing. If the patient is confused and wandering, either gently guide him/her away and block access to outside areas - Reassure the individual and be comforting - Call 911. In most cases, the seizure ends before EMS arrives. However, there are cases when seizures may last over 3 to 5 minutes. Or the individual may have developed breathing difficulties or severe injuries. If a pregnant patient or a person with diabetes develops a seizure, it is prudent to call an ambulance. - Finally, if the patient does not regain full consciousness, then call EMS. Most patients will remain confused for about 45 to 90 minutes after a seizure, so you must use judgment in calling for help. - Avoid restraints but make sure the patient is in a bed with padded side rails - Place the individual in a lateral position with the neck slightly flexed; this will help the saliva drain from the mouth and prevent the tongue from falling backward - Remove all nearby furniture and other hazards from the area - Provide verbal assurance as the individual is regaining consciousness - Provide the patient with privacy if possible - Call for help and start treatment as ordered by the caregiver   After the Seizure (Postictal Stage)  After a seizure, most patients experience confusion, fatigue, muscle pain and/or a headache. Thus, one should permit the individual to sleep. For the next few days, reassurance is essential. Being calm and helping reorient the person is also of importance.  Most seizures are painless and end spontaneously. Seizures are not harmful to others but  can lead to complications such as stress on the lungs, brain and the heart. Individuals with prior lung problems may develop labored breathing and respiratory distress.    Discussed Patients with epilepsy have a small risk of sudden unexpected death, a condition referred to as sudden unexpected death in epilepsy (SUDEP). SUDEP is defined specifically as the sudden, unexpected, witnessed or unwitnessed, nontraumatic and nondrowning death in patients with epilepsy with or without evidence for a seizure, and excluding documented status epilepticus, in which post mortem examination does not reveal a structural or toxicologic cause for death     Orders Placed This Encounter  Procedures   MR BRAIN W WO CONTRAST   Levetiracetam  level   Levetiracetam  level   EEG adult    Meds ordered this encounter  Medications   levETIRAcetam  (KEPPRA ) 500 MG tablet    Sig: Take 1 tablet (500 mg total) by mouth 2 (two) times daily.    Dispense:  180 tablet    Refill:  4   diazePAM , 20 MG Dose, (VALTOCO  20 MG DOSE) 2 x 10 MG/0.1ML LQPK    Sig: Place 20 mg into the nose as needed (for seizure lating more than 2 minutes).    Dispense:  3 each    Refill:  0    Please provide 3 boxes    Return in about 6 months (around 12/19/2024).    Pastor Falling, MD 06/21/2024, 9:18 AM  [facility] [address], [address] [place], [place] [address] [phone]

## 2024-06-21 NOTE — Patient Instructions (Signed)
 Continue with Keppra  500 mg twice daily, refill given Will obtain routine EEG MRI brain with and without contrast Will give Valtoco  as rescue medication Please contact me if you do have a breakthrough seizure Follow-up in 6 to 8 months or sooner if worse Discussed driving restriction for total of 6 months, patient voiced understanding.

## 2024-06-22 LAB — LEVETIRACETAM LEVEL: Levetiracetam Lvl: 21 ug/mL (ref 10.0–40.0)

## 2024-06-23 ENCOUNTER — Ambulatory Visit: Payer: Self-pay | Admitting: Neurology

## 2024-06-27 ENCOUNTER — Telehealth: Payer: Self-pay | Admitting: Neurology

## 2024-06-27 NOTE — Telephone Encounter (Signed)
 [facility]: [id number] exp. 06/22/2024-08/21/2024 sent to GI [phone]

## 2024-07-01 DIAGNOSIS — Z419 Encounter for procedure for purposes other than remedying health state, unspecified: Secondary | ICD-10-CM | POA: Diagnosis not present

## 2024-07-14 ENCOUNTER — Ambulatory Visit: Admitting: Neurology

## 2024-07-14 DIAGNOSIS — Z5181 Encounter for therapeutic drug level monitoring: Secondary | ICD-10-CM

## 2024-07-14 DIAGNOSIS — G40209 Localization-related (focal) (partial) symptomatic epilepsy and epileptic syndromes with complex partial seizures, not intractable, without status epilepticus: Secondary | ICD-10-CM

## 2024-07-14 NOTE — Procedures (Signed)
    History:  63 year old woman with history of seizure   EEG classification: Awake and drowsy  Duration: 25 minutes   Technical aspects: This EEG study was done with scalp electrodes positioned according to the 10-20 International system of electrode placement. Electrical activity was reviewed with band pass filter of 1-70Hz , sensitivity of 7 uV/mm, display speed of 8mm/sec with a 60Hz  notched filter applied as appropriate. EEG data were recorded continuously and digitally stored.   Description of the recording: The background rhythms of this recording consists of a fairly well modulated medium amplitude alpha rhythm of 11 Hz that is reactive to eye opening and closure. Present in the anterior head region is a 15-20 Hz beta activity. Photic stimulation was performed, did not show any abnormalities. Hyperventilation was also performed, did not show any abnormalities. Drowsiness was manifested by background fragmentation. No abnormal epileptiform discharges seen during this recording. There was intermittent left temporal focal slowing. There were no electrographic seizure identified.   Abnormality: Intermittent left temporal focal slowing   Impression: This EEG is suggestive of neuronal dysfunction within the left temporal region. No seizure or epileptiform discharges seen during this recording.   Shaliyah Taite, MD [facility]

## 2024-07-23 ENCOUNTER — Ambulatory Visit
Admission: RE | Admit: 2024-07-23 | Discharge: 2024-07-23 | Disposition: A | Discharge status: Home / Self Care | Source: Ambulatory Visit | Attending: Neurology | Admitting: Neurology

## 2024-07-23 DIAGNOSIS — G40209 Localization-related (focal) (partial) symptomatic epilepsy and epileptic syndromes with complex partial seizures, not intractable, without status epilepticus: Secondary | ICD-10-CM | POA: Diagnosis not present

## 2024-07-23 MED ORDER — GADOPICLENOL 0.5 MMOL/ML IV SOLN
6.0000 mL | Freq: Once | INTRAVENOUS | Status: AC | PRN
  Administered 2024-07-23: 6 mL via INTRAVENOUS

## 2024-08-31 ENCOUNTER — Telehealth: Payer: Self-pay | Admitting: Neurology

## 2024-08-31 DIAGNOSIS — G40209 Localization-related (focal) (partial) symptomatic epilepsy and epileptic syndromes with complex partial seizures, not intractable, without status epilepticus: Secondary | ICD-10-CM

## 2024-08-31 DIAGNOSIS — Z5181 Encounter for therapeutic drug level monitoring: Secondary | ICD-10-CM

## 2024-08-31 DIAGNOSIS — Z419 Encounter for procedure for purposes other than remedying health state, unspecified: Secondary | ICD-10-CM | POA: Diagnosis not present

## 2024-08-31 NOTE — Telephone Encounter (Signed)
 Pt called stating that the levETIRAcetam  (KEPPRA ) 500 MG tablet is making her gums swell so she would like to know if there can be a medication change. Please advise.

## 2024-09-01 ENCOUNTER — Other Ambulatory Visit: Payer: Self-pay | Admitting: Neurology

## 2024-09-01 MED ORDER — LAMOTRIGINE 100 MG PO TABS: 100.0000 mg | ORAL_TABLET | Freq: Two times a day (BID) | ORAL | 6 refills | Status: DC

## 2024-09-01 MED ORDER — LAMOTRIGINE 25 MG PO TABS: ORAL_TABLET | ORAL | 0 refills | Status: DC

## 2024-09-01 NOTE — Telephone Encounter (Signed)
 Call to patient and reviewed medication instructions and next steps. She verbalized understanding. Also sent taper schedule in MyChart message

## 2024-09-01 NOTE — Telephone Encounter (Signed)
 Call to patient, she reports for the last few weeks her gums have been swollen. She denies any new foods or other new medications. She believes this has to do with the Keppra . She denies any facial, tongue, or throat swelling. No difficulty breathing. Advised I would reach out to Dr. Gregg and follow back up with his advice /recommendations

## 2024-09-01 NOTE — Telephone Encounter (Signed)
 Please call and advise patient that we are going to switch her from Keppra  to Lamotrigine.  For now continue with Keppra  500 mg twice daily.  Start Lamotrigine.  Week 1: 25mg  (one pill) at night  Week 2: 25mg  (one pill) twice daily Week 3: 50mg  (two pills) twice daily and decrease Keppra  to 500 mg nightly  Week 4: 75mg  (three pills) twice daily and Keppra  500 mg nightly  Week 5: 100mg  (fours pills) twice daily and stop Keppra   Please call for any issues     Will obtain a Lamotrigine level (blood test) and complete metabolic panel after week 6  Please stop the medication and call the office as soon as you develop a new rash

## 2024-09-28 ENCOUNTER — Other Ambulatory Visit: Payer: Self-pay | Admitting: Neurology

## 2024-09-30 DIAGNOSIS — Z419 Encounter for procedure for purposes other than remedying health state, unspecified: Secondary | ICD-10-CM | POA: Diagnosis not present

## 2024-10-04 ENCOUNTER — Ambulatory Visit (HOSPITAL_BASED_OUTPATIENT_CLINIC_OR_DEPARTMENT_OTHER)

## 2024-10-04 DIAGNOSIS — M25561 Pain in right knee: Secondary | ICD-10-CM | POA: Diagnosis not present

## 2024-10-04 DIAGNOSIS — S8391XA Sprain of unspecified site of right knee, initial encounter: Secondary | ICD-10-CM | POA: Diagnosis not present

## 2024-10-24 ENCOUNTER — Telehealth: Payer: Self-pay | Admitting: Neurology

## 2024-10-24 DIAGNOSIS — Z5181 Encounter for therapeutic drug level monitoring: Secondary | ICD-10-CM

## 2024-10-24 DIAGNOSIS — G40209 Localization-related (focal) (partial) symptomatic epilepsy and epileptic syndromes with complex partial seizures, not intractable, without status epilepticus: Secondary | ICD-10-CM

## 2024-10-24 DIAGNOSIS — Z0289 Encounter for other administrative examinations: Secondary | ICD-10-CM

## 2024-10-24 NOTE — Telephone Encounter (Signed)
 Patient called to make sure can  come to get bloodwork today. Was suppose to come the week of December 22. Patient said coming today before 4:30 pm

## 2024-10-25 LAB — COMPREHENSIVE METABOLIC PANEL WITH GFR
ALT: 23 IU/L (ref 0–32)
AST: 22 IU/L (ref 0–40)
Albumin: 4.6 g/dL (ref 3.9–4.9)
Alkaline Phosphatase: 97 IU/L (ref 49–135)
BUN/Creatinine Ratio: 19 (ref 12–28)
BUN: 16 mg/dL (ref 8–27)
Bilirubin Total: 0.3 mg/dL (ref 0.0–1.2)
CO2: 19 mmol/L — ABNORMAL LOW (ref 20–29)
Calcium: 9.6 mg/dL (ref 8.7–10.3)
Chloride: 105 mmol/L (ref 96–106)
Creatinine, Ser: 0.86 mg/dL (ref 0.57–1.00)
Globulin, Total: 3 g/dL (ref 1.5–4.5)
Glucose: 96 mg/dL (ref 70–99)
Potassium: 3.6 mmol/L (ref 3.5–5.2)
Sodium: 145 mmol/L — ABNORMAL HIGH (ref 134–144)
Total Protein: 7.6 g/dL (ref 6.0–8.5)
eGFR: 76 mL/min/1.73

## 2024-10-25 LAB — LAMOTRIGINE LEVEL: Lamotrigine Lvl: 10.9 ug/mL (ref 2.0–20.0)

## 2024-10-26 ENCOUNTER — Ambulatory Visit: Payer: Self-pay | Admitting: Neurology

## 2024-11-01 ENCOUNTER — Telehealth: Payer: Self-pay | Admitting: Neurology

## 2024-11-01 NOTE — Telephone Encounter (Signed)
 Pt called to request  to speak to  Nurse about medication lamoTRIgine  (LAMICTAL ) 100 MG tablet  , Pt stated that medication is making her feel wobbly Pt stated she can be standing still and her body feel like she is moving but she is not. Pt stated she not sure why she is feeling this way . Pt would like to speak to MD about this issue .

## 2024-11-01 NOTE — Telephone Encounter (Signed)
 I called pt.  She says that she has noted since she started taking the lamotrigine  that she feels some loss of coordination. She has been on the lamotrigine  100mg  po bid for a month now and states this is a little bit worse. She feels wobbly.  Pt stated she can be standing still and her body feel like she is moving but she is not. No other symptoms. Had a slight fall moving forward going up steps.  She wonders if this will continue to get better, and how long approximately.  Dr. Gregg is out of the office.  Will send to work in for there input and let her know, most likely tomorrow.

## 2024-11-03 NOTE — Telephone Encounter (Signed)
 I called pt and she relayed that yes since starting the lamotrigine  she has noted as it was increased that her balance has worsened. She is ok for PT.  She will read the message per Dr. Chalice and get back with me on mychart.

## 2024-11-11 MED ORDER — LAMOTRIGINE 25 MG PO TABS: ORAL_TABLET | ORAL | 1 refills | Status: AC

## 2024-11-11 MED ORDER — LAMOTRIGINE 100 MG PO TABS: 100.0000 mg | ORAL_TABLET | Freq: Every day | ORAL | 1 refills | Status: AC

## 2024-11-11 NOTE — Addendum Note (Signed)
 Addended by: CHALICE SAUNAS on: 11/11/2024 12:09 PM   Modules accepted: Orders

## 2025-01-31 ENCOUNTER — Ambulatory Visit: Admitting: Adult Health
